# Patient Record
Sex: Male | Born: 1960 | Race: White | Hispanic: No | Marital: Married | Smoking: Former smoker
Health system: Southern US, Community
[De-identification: ages and names within clinical notes are randomized; demographics above are authoritative.]

## PROBLEM LIST (undated history)

## (undated) DIAGNOSIS — I509 Heart failure, unspecified: Secondary | ICD-10-CM

## (undated) DIAGNOSIS — K219 Gastro-esophageal reflux disease without esophagitis: Secondary | ICD-10-CM

## (undated) DIAGNOSIS — I219 Acute myocardial infarction, unspecified: Secondary | ICD-10-CM

## (undated) DIAGNOSIS — R519 Headache, unspecified: Secondary | ICD-10-CM

## (undated) DIAGNOSIS — IMO0001 Reserved for inherently not codable concepts without codable children: Secondary | ICD-10-CM

## (undated) DIAGNOSIS — F32A Depression, unspecified: Secondary | ICD-10-CM

## (undated) DIAGNOSIS — Z95810 Presence of automatic (implantable) cardiac defibrillator: Secondary | ICD-10-CM

## (undated) DIAGNOSIS — I209 Angina pectoris, unspecified: Secondary | ICD-10-CM

## (undated) DIAGNOSIS — I639 Cerebral infarction, unspecified: Secondary | ICD-10-CM

## (undated) DIAGNOSIS — I499 Cardiac arrhythmia, unspecified: Secondary | ICD-10-CM

## (undated) DIAGNOSIS — I10 Essential (primary) hypertension: Secondary | ICD-10-CM

## (undated) DIAGNOSIS — F329 Major depressive disorder, single episode, unspecified: Secondary | ICD-10-CM

## (undated) DIAGNOSIS — I2510 Atherosclerotic heart disease of native coronary artery without angina pectoris: Secondary | ICD-10-CM

## (undated) DIAGNOSIS — N2889 Other specified disorders of kidney and ureter: Secondary | ICD-10-CM

## (undated) DIAGNOSIS — R51 Headache: Secondary | ICD-10-CM

## (undated) DIAGNOSIS — Z950 Presence of cardiac pacemaker: Secondary | ICD-10-CM

## (undated) DIAGNOSIS — M1990 Unspecified osteoarthritis, unspecified site: Secondary | ICD-10-CM

## (undated) DIAGNOSIS — R011 Cardiac murmur, unspecified: Secondary | ICD-10-CM

## (undated) HISTORY — PX: CARDIAC VALVE REPLACEMENT: SHX585

## (undated) HISTORY — PX: CORONARY ANGIOPLASTY: SHX604

## (undated) HISTORY — PX: CORONARY ARTERY BYPASS GRAFT: SHX141

---

## 1998-10-24 ENCOUNTER — Inpatient Hospital Stay (HOSPITAL_COMMUNITY): Admission: EM | Admit: 1998-10-24 | Discharge: 1998-10-27 | Payer: Self-pay | Admitting: Emergency Medicine

## 1998-10-24 ENCOUNTER — Encounter: Payer: Self-pay | Admitting: Emergency Medicine

## 2000-11-30 ENCOUNTER — Encounter: Payer: Self-pay | Admitting: Emergency Medicine

## 2000-11-30 ENCOUNTER — Inpatient Hospital Stay (HOSPITAL_COMMUNITY): Admission: EM | Admit: 2000-11-30 | Discharge: 2000-12-03 | Payer: Self-pay | Admitting: Emergency Medicine

## 2001-11-16 ENCOUNTER — Inpatient Hospital Stay (HOSPITAL_COMMUNITY): Admission: EM | Admit: 2001-11-16 | Discharge: 2001-11-19 | Payer: Self-pay | Admitting: Emergency Medicine

## 2001-11-16 ENCOUNTER — Encounter: Payer: Self-pay | Admitting: Emergency Medicine

## 2001-12-09 ENCOUNTER — Encounter (HOSPITAL_COMMUNITY): Admission: RE | Admit: 2001-12-09 | Discharge: 2002-02-11 | Payer: Self-pay | Admitting: Internal Medicine

## 2002-07-07 ENCOUNTER — Encounter: Payer: Self-pay | Admitting: Surgery

## 2002-07-07 ENCOUNTER — Inpatient Hospital Stay (HOSPITAL_COMMUNITY): Admission: EM | Admit: 2002-07-07 | Discharge: 2002-07-12 | Payer: Self-pay | Admitting: Emergency Medicine

## 2002-07-07 ENCOUNTER — Encounter: Payer: Self-pay | Admitting: Emergency Medicine

## 2002-07-08 ENCOUNTER — Encounter: Payer: Self-pay | Admitting: Surgery

## 2002-07-09 ENCOUNTER — Encounter: Payer: Self-pay | Admitting: Surgery

## 2002-07-31 ENCOUNTER — Encounter (HOSPITAL_COMMUNITY): Admission: RE | Admit: 2002-07-31 | Discharge: 2002-09-03 | Payer: Self-pay | Admitting: Internal Medicine

## 2002-08-25 ENCOUNTER — Inpatient Hospital Stay (HOSPITAL_COMMUNITY): Admission: EM | Admit: 2002-08-25 | Discharge: 2002-08-31 | Payer: Self-pay | Admitting: Psychiatry

## 2003-06-01 ENCOUNTER — Inpatient Hospital Stay (HOSPITAL_COMMUNITY): Admission: EM | Admit: 2003-06-01 | Discharge: 2003-06-02 | Payer: Self-pay | Admitting: Emergency Medicine

## 2003-06-08 ENCOUNTER — Encounter: Admission: RE | Admit: 2003-06-08 | Discharge: 2003-06-08 | Payer: Self-pay | Admitting: Family Medicine

## 2003-07-20 ENCOUNTER — Emergency Department (HOSPITAL_COMMUNITY): Admission: EM | Admit: 2003-07-20 | Discharge: 2003-07-20 | Payer: Self-pay | Admitting: Emergency Medicine

## 2003-07-20 ENCOUNTER — Inpatient Hospital Stay (HOSPITAL_COMMUNITY): Admission: RE | Admit: 2003-07-20 | Discharge: 2003-07-26 | Payer: Self-pay | Admitting: Psychiatry

## 2004-03-19 ENCOUNTER — Emergency Department (HOSPITAL_COMMUNITY): Admission: EM | Admit: 2004-03-19 | Discharge: 2004-03-20 | Payer: Self-pay | Admitting: Emergency Medicine

## 2004-04-28 ENCOUNTER — Ambulatory Visit: Payer: Self-pay | Admitting: Internal Medicine

## 2005-06-27 IMAGING — CR DG CHEST 2V
2 series · 2 of 2 positions shown · non-contrast
Comparison: 06/01/03.

CLINICAL DATA: Chest pain. 
 TWO VIEW CHEST  - 03/19/04:

[view not recorded (1 of 2)]
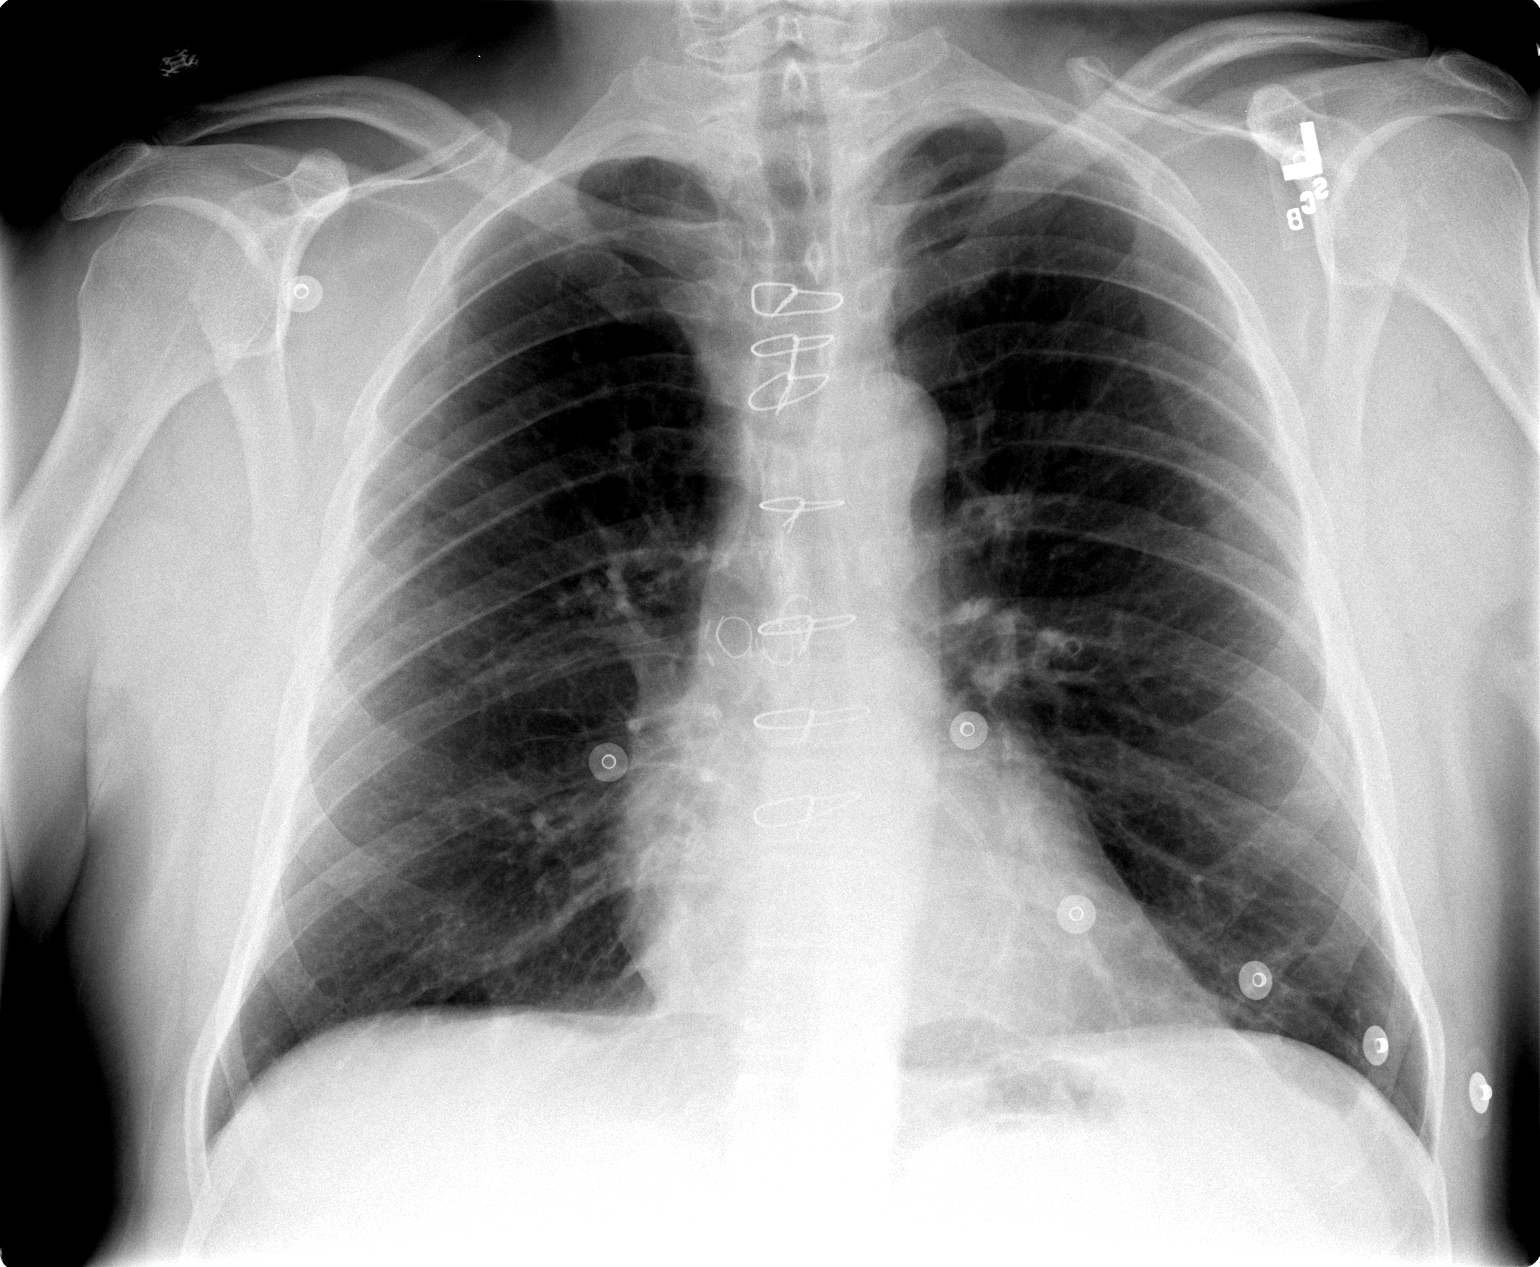

[view not recorded (2 of 2)]
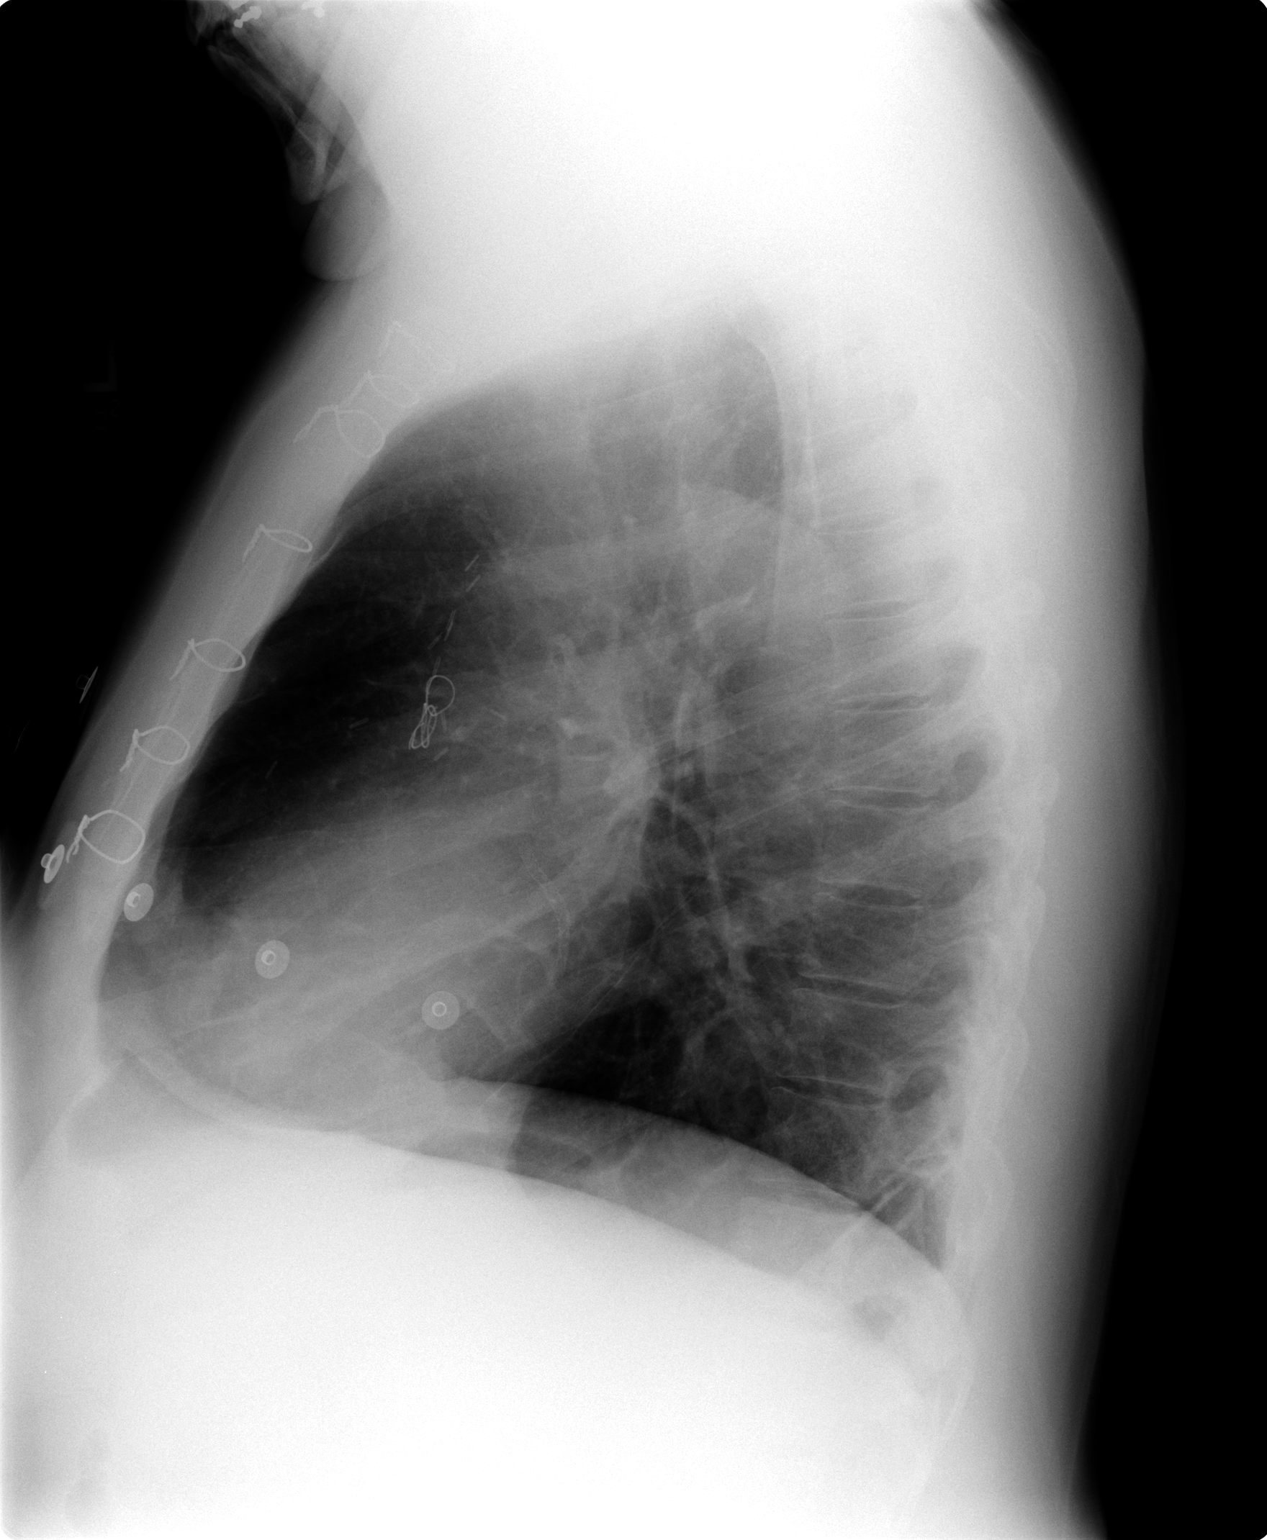

[2 of 2 positions shown; findings below may reference images not displayed]

There has been a previous median sternotomy.  The lungs are clear.  The heart is normal in size.
IMPRESSION: Evidence for previous median sternotomy.  No evidence for active chest disease.

## 2006-01-14 ENCOUNTER — Ambulatory Visit: Payer: Self-pay | Admitting: Internal Medicine

## 2006-01-14 ENCOUNTER — Inpatient Hospital Stay (HOSPITAL_COMMUNITY): Admission: EM | Admit: 2006-01-14 | Discharge: 2006-01-18 | Payer: Self-pay | Admitting: Family Medicine

## 2006-01-15 ENCOUNTER — Encounter: Payer: Self-pay | Admitting: Internal Medicine

## 2006-01-29 ENCOUNTER — Ambulatory Visit: Payer: Self-pay | Admitting: Cardiology

## 2006-03-01 ENCOUNTER — Ambulatory Visit: Payer: Self-pay | Admitting: Family Medicine

## 2006-03-30 ENCOUNTER — Ambulatory Visit: Payer: Self-pay | Admitting: Family Medicine

## 2006-07-12 ENCOUNTER — Ambulatory Visit: Payer: Self-pay | Admitting: Family Medicine

## 2006-07-13 ENCOUNTER — Encounter: Payer: Self-pay | Admitting: Family Medicine

## 2006-07-16 ENCOUNTER — Ambulatory Visit: Payer: Self-pay | Admitting: Family Medicine

## 2006-07-23 ENCOUNTER — Ambulatory Visit: Payer: Self-pay | Admitting: Family Medicine

## 2006-08-17 DIAGNOSIS — I10 Essential (primary) hypertension: Secondary | ICD-10-CM | POA: Insufficient documentation

## 2006-08-17 DIAGNOSIS — I2510 Atherosclerotic heart disease of native coronary artery without angina pectoris: Secondary | ICD-10-CM | POA: Insufficient documentation

## 2006-08-17 DIAGNOSIS — Z9189 Other specified personal risk factors, not elsewhere classified: Secondary | ICD-10-CM | POA: Insufficient documentation

## 2006-10-29 ENCOUNTER — Ambulatory Visit: Payer: Self-pay | Admitting: Family Medicine

## 2006-10-29 DIAGNOSIS — R1011 Right upper quadrant pain: Secondary | ICD-10-CM | POA: Insufficient documentation

## 2006-10-29 LAB — CONVERTED CEMR LAB
Alkaline Phosphatase: 66 units/L (ref 39–117)
Basophils Relative: 1.3 % — ABNORMAL HIGH (ref 0.0–1.0)
Bilirubin, Direct: 0.2 mg/dL (ref 0.0–0.3)
CO2: 30 meq/L (ref 19–32)
Creatinine, Ser: 1 mg/dL (ref 0.4–1.5)
Direct LDL: 145 mg/dL
Eosinophils Relative: 4.6 % (ref 0.0–5.0)
Glucose, Bld: 99 mg/dL (ref 70–99)
Glucose, Urine, Semiquant: NEGATIVE
HCT: 45.1 % (ref 39.0–52.0)
Hemoglobin: 15.8 g/dL (ref 13.0–17.0)
Lymphocytes Relative: 26.8 % (ref 12.0–46.0)
Monocytes Absolute: 0.9 10*3/uL — ABNORMAL HIGH (ref 0.2–0.7)
Neutrophils Relative %: 57.9 % (ref 43.0–77.0)
Potassium: 4.7 meq/L (ref 3.5–5.1)
Protein, U semiquant: NEGATIVE
RDW: 13.1 % (ref 11.5–14.6)
Sodium: 143 meq/L (ref 135–145)
TSH: 2.46 microintl units/mL (ref 0.35–5.50)
Total Bilirubin: 1.1 mg/dL (ref 0.3–1.2)
Total CHOL/HDL Ratio: 8.9
Total Protein: 7.6 g/dL (ref 6.0–8.3)
Urobilinogen, UA: 0.2
VLDL: 71 mg/dL — ABNORMAL HIGH (ref 0–40)
WBC Urine, dipstick: NEGATIVE

## 2006-11-02 ENCOUNTER — Telehealth: Payer: Self-pay | Admitting: Family Medicine

## 2006-11-02 ENCOUNTER — Ambulatory Visit: Payer: Self-pay | Admitting: Family Medicine

## 2006-11-02 DIAGNOSIS — E785 Hyperlipidemia, unspecified: Secondary | ICD-10-CM

## 2006-11-05 ENCOUNTER — Ambulatory Visit: Payer: Self-pay | Admitting: Family Medicine

## 2006-11-07 ENCOUNTER — Encounter: Admission: RE | Admit: 2006-11-07 | Discharge: 2006-11-07 | Payer: Self-pay | Admitting: Family Medicine

## 2006-12-05 ENCOUNTER — Ambulatory Visit: Payer: Self-pay | Admitting: Family Medicine

## 2006-12-05 DIAGNOSIS — J309 Allergic rhinitis, unspecified: Secondary | ICD-10-CM | POA: Insufficient documentation

## 2007-03-14 DIAGNOSIS — L0293 Carbuncle, unspecified: Secondary | ICD-10-CM

## 2007-03-14 DIAGNOSIS — L0292 Furuncle, unspecified: Secondary | ICD-10-CM | POA: Insufficient documentation

## 2007-03-18 ENCOUNTER — Ambulatory Visit: Payer: Self-pay | Admitting: Family Medicine

## 2007-03-19 ENCOUNTER — Encounter: Payer: Self-pay | Admitting: Family Medicine

## 2007-03-21 ENCOUNTER — Telehealth: Payer: Self-pay | Admitting: Family Medicine

## 2007-05-14 ENCOUNTER — Ambulatory Visit: Payer: Self-pay | Admitting: Cardiology

## 2007-05-14 ENCOUNTER — Inpatient Hospital Stay (HOSPITAL_COMMUNITY): Admission: EM | Admit: 2007-05-14 | Discharge: 2007-05-16 | Payer: Self-pay | Admitting: Emergency Medicine

## 2007-05-15 ENCOUNTER — Ambulatory Visit: Payer: Self-pay | Admitting: Cardiothoracic Surgery

## 2007-06-10 ENCOUNTER — Ambulatory Visit: Payer: Self-pay | Admitting: Cardiology

## 2007-06-10 LAB — CONVERTED CEMR LAB
Alkaline Phosphatase: 48 units/L (ref 39–117)
Bilirubin, Direct: 0.1 mg/dL (ref 0.0–0.3)
GFR calc Af Amer: 76 mL/min
Glucose, Bld: 92 mg/dL (ref 70–99)
Potassium: 4.2 meq/L (ref 3.5–5.1)
Sodium: 141 meq/L (ref 135–145)
Total Protein: 8 g/dL (ref 6.0–8.3)
VLDL: 27 mg/dL (ref 0–40)

## 2008-06-17 ENCOUNTER — Encounter (INDEPENDENT_AMBULATORY_CARE_PROVIDER_SITE_OTHER): Payer: Self-pay | Admitting: *Deleted

## 2008-08-10 ENCOUNTER — Telehealth (INDEPENDENT_AMBULATORY_CARE_PROVIDER_SITE_OTHER): Payer: Self-pay | Admitting: *Deleted

## 2008-08-21 IMAGING — CR DG CHEST 1V PORT
1 series · 1 of 1 positions shown · non-contrast
Comparison: 01/15/2006

CLINICAL DATA: Chest pain

PORTABLE CHEST - 1 VIEW

[AP]
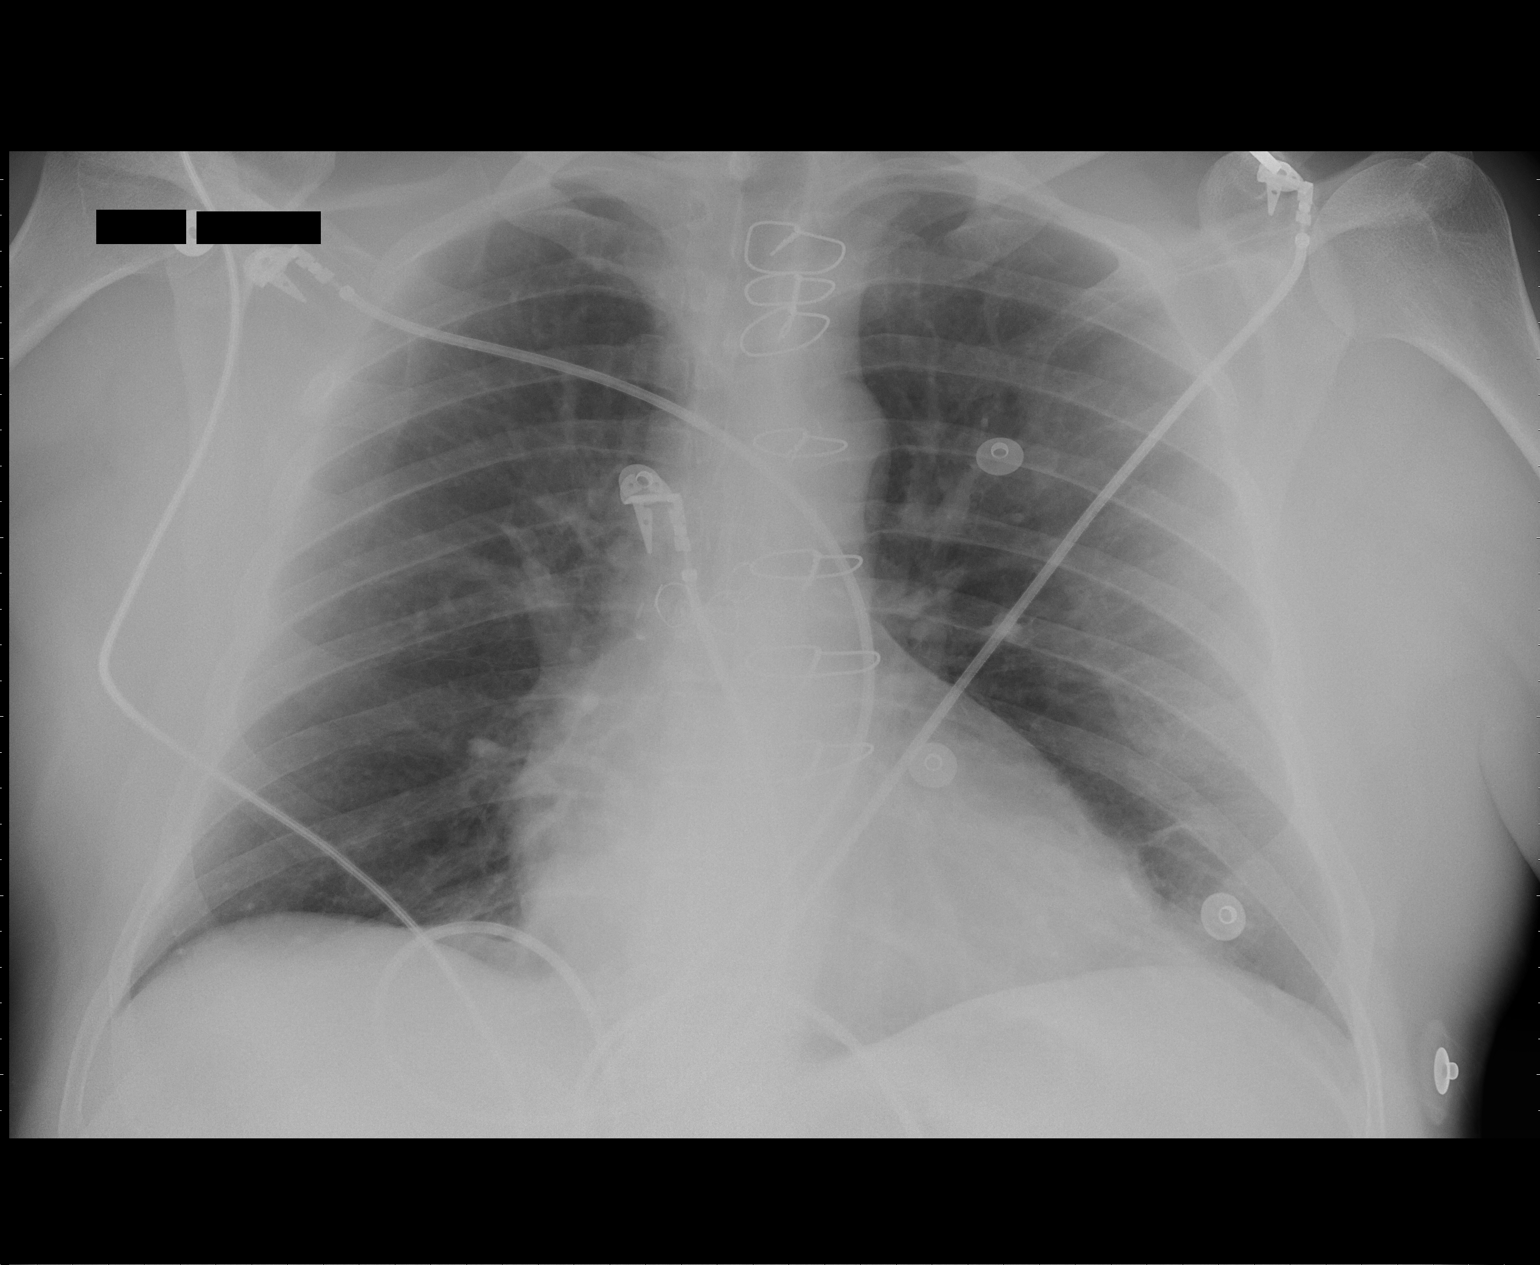

[1 of 1 positions shown; findings below may reference images not displayed]

FINDINGS: Post CABG.  Heart, vascularity, and lungs normal.
Minimal scarring at the left base.  Osseous structures intact in
one-view.
IMPRESSION: Chronic and postoperative changes - no active disease.

## 2009-08-06 ENCOUNTER — Telehealth (INDEPENDENT_AMBULATORY_CARE_PROVIDER_SITE_OTHER): Payer: Self-pay | Admitting: *Deleted

## 2010-01-23 DIAGNOSIS — I639 Cerebral infarction, unspecified: Secondary | ICD-10-CM

## 2010-01-23 HISTORY — DX: Cerebral infarction, unspecified: I63.9

## 2010-02-22 NOTE — Progress Notes (Signed)
 ----   Converted from flag ---- ---- 08/06/2009 4:42 PM, Julieta Gutting, RN, BSN wrote:   ---- 08/06/2009 4:33 PM, Ferman Hamming, MD, [facility] wrote: Please ask patient to schedule f/u ov (I have not seen in over 2 years); tell him to dc zocor until then and begin crestor 20 daily; lipids and liver in six weeks BC  ---- 08/06/2009 3:38 PM, Julieta Gutting, RN, BSN wrote: this pt is requesting a refill for Simvastatin 80mg  and is taking Norvasc 10mg --please send flag back to the [facility] triage nurse because Stanton Kidney and I are both out next week ------------------------------  Phone Note Outgoing Call   Call placed by: Scherrie Bateman, LPN,  August 11, 2009 12:19 PM Call placed to: Patient's wife Summary of Call: SPOKE WITH PT'S WIFE SEES CARDIOLOGIST OUT OF [place] INSTRUCTED TO CALL THAT OFFICE  FOR ANY REFILLS.VERBALIZED UNDERSTANDING. Initial call taken by: Scherrie Bateman, LPN,  August 11, 2009 12:20 PM

## 2010-04-11 DIAGNOSIS — I2510 Atherosclerotic heart disease of native coronary artery without angina pectoris: Secondary | ICD-10-CM

## 2010-04-11 DIAGNOSIS — I059 Rheumatic mitral valve disease, unspecified: Secondary | ICD-10-CM

## 2010-04-13 DIAGNOSIS — I059 Rheumatic mitral valve disease, unspecified: Secondary | ICD-10-CM

## 2010-04-13 DIAGNOSIS — I2510 Atherosclerotic heart disease of native coronary artery without angina pectoris: Secondary | ICD-10-CM

## 2010-04-26 DIAGNOSIS — I2510 Atherosclerotic heart disease of native coronary artery without angina pectoris: Secondary | ICD-10-CM

## 2010-04-26 DIAGNOSIS — I059 Rheumatic mitral valve disease, unspecified: Secondary | ICD-10-CM

## 2010-04-28 DIAGNOSIS — I059 Rheumatic mitral valve disease, unspecified: Secondary | ICD-10-CM

## 2010-04-28 DIAGNOSIS — I2510 Atherosclerotic heart disease of native coronary artery without angina pectoris: Secondary | ICD-10-CM

## 2010-05-10 ENCOUNTER — Encounter (INDEPENDENT_AMBULATORY_CARE_PROVIDER_SITE_OTHER): Payer: Medicaid Other

## 2010-05-10 DIAGNOSIS — I2510 Atherosclerotic heart disease of native coronary artery without angina pectoris: Secondary | ICD-10-CM

## 2010-05-10 DIAGNOSIS — I059 Rheumatic mitral valve disease, unspecified: Secondary | ICD-10-CM

## 2010-05-12 NOTE — Assessment & Plan Note (Unsigned)
[place] OFFICE VISIT  TIA, GELB DOB:  09-May-1960                                        May 11, 2010 CHART #:  [id number]  In the clinic today, to remove the staples from his sternal incision. The staples were removed and the wound is healing nicely.  The sternum is stable and the patient is doing quite well at home.  He is walking and will be seeing his cardiologist next week.  He will return for his routine followup with [place] in approximately 4 weeks.  Tera Mater. Arvilla Market, MD  BC/MEDQ  D:  05/11/2010  T:  05/12/2010  Job:  [id number]

## 2010-06-07 NOTE — Cardiovascular Report (Signed)
[name], [name] NO.:  [id number]   MEDICAL RECORD NO.:  [id number]          PATIENT TYPE:  INP   LOCATION:  1827                         FACILITY:  [facility]   PHYSICIAN:  Veverly Fells. Excell Seltzer, MD  DATE OF BIRTH:  Jun 06, 1960   DATE OF PROCEDURE:  DATE OF DISCHARGE:                            CARDIAC CATHETERIZATION   PROCEDURE:  Left heart catheterization, selective coronary angiography,  left ventricular angiography, saphenous vein graft angiography, PTCA and  stenting of the saphenous vein graft to the right coronary artery and  Angio-Seal of the right femoral artery.   INDICATIONS:  [name] is a 50 year old gentleman who presented with an  acute coronary syndrome.  He has known CAD and prior bypass surgery.  He  has also had prior PCI.  He presented with new onset chest pain and had  dynamic EKG changes.  There was minimal inferior ST elevation.  He was  brought up urgently for cardiac catheterization.   Risks and indications of procedure were reviewed with the patient.  Informed consent was obtained.  The right groin was prepped and draped,  anesthetized with 1% lidocaine.  Using modified Seldinger technique, a 6-  French sheath was placed in the right femoral artery.  Standard 6-French  catheters were used for coronary angiography and left ventriculography.  The JR-4 catheter was used for saphenous vein graft angiography.   At the completion of the diagnostic procedure, I elected to intervene on  a severely degenerated saphenous vein graft that was sequenced to the  PDA and posterolateral branches of the right coronary artery.  This was  clearly the patient's culprit vessel.  The native vessels are very small  and not suitable for redo surgery.  The saphenous vein graft has severe  degeneration with stenosis throughout in multiple areas.  I called Dr.  Donata Clay who was on-call for cardiac surgery and he came down to review  the films.  He was in  agreement that the targets were not suitable for  redo surgery.  Angiomax was used for anticoagulation.  The patient was  loaded with 600 mg of Plavix.  The sheath was up sized to a 7-French.  A  7-French RCB guide catheter was inserted and initially I attempted to  use Proxis proximal embolic protection device.  However, the vein graft  turned out to be too big in the proximal portion for that device to  occlude flow.  Therefore, I elected to treat the proximal portion of the  vein graft using a filter wire for distal protection.  The filter wire  was advanced into the distal body of the graft without difficulty.  There was a long area of severe stenosis with severe graft degeneration  and it was treated with 2 overlapping driver stents.  The first stent  was a 4.0 x 30-mm driver, which was deployed at 14 atmospheres, the  second stent which was placed in overlapping fashion in the proximal  portion of the vein graft was a 4.0 x 24-mm driver, it was deployed at  16 atmospheres.  Following stenting,  there was good flow in the vessel.  I elected to post dilate the entire area with a 5.0 x 15-mm Quantum  Maverick, which was dilated on 5 inflations up to a maximum of 16  atmospheres to cover the entire stented segment.  At that point, I  elected to advance the Proxis device down further so that it could be  used to protect the distal graft lesions.  There was a lesion in the  distal body of the first portion of the graft and has lesion in the  sequence portion of the graft as well.  Both lesions were severe in the  range of 80-90%.  After the Proxis device was advanced down, the balloon  was inflated and the vessel was wired with a Krueger guidewire into the  distal PL branch.  The downstream lesions were then dilated with a 2.5 x  15-mm sprinter, which was taken to 8 atmospheres in the distal sequence  graft and 10 atmospheres in the first portion of the graft.  I then  elected to stent the  distal lesion with a 3.0 x 15-mm driver, which was  deployed at 14 atmospheres.  The stent was well expanded and there was  good flow following stent deployment.  The second lesion which again was  in the distal portion of the first part of the sequence graft was a 3.5  x 30-mm driver stent.  This was deployed at 14 atmospheres.  This stent  also was well expanded and demonstrated good flow following stenting.  The Proxis device was then pulled back into the guide catheter.  Final  angiography demonstrated widely patent stents with TIMI III flow.  There  was an excellent angiographic result.  The patient tolerated the  procedure well and had no immediate complications.  An 8-French  AngioSeal device was used to close the femoral arteriotomy.   FINDINGS:  Left ventricular pressure 118/16, aortic pressure 123/72 with  a mean of 93.   CORONARY ANGIOGRAPHY:  The left mainstem is diffusely diseased.  There  is 50% stenosis throughout.  It bifurcates into the LAD and left  circumflex.   LAD:  The LAD is a moderate size vessel.  It courses down and reaches  the LV apex of the proximal LAD just before the first diagonal has a  smooth 70% stenosis.  The vessel then has mild diffuse disease  throughout its remaining portions.  Overall, the LAD is widely patent  other than the lesion mentioned above.  The first diagonal branch is  grafted.  The native vessel images show retrograde flow into the graft.  There are two lower diagonals that are patent with no significant  stenosis.   LEFT CIRCUMFLEX:  The left circumflex is severely diseased in its  proximal portion and occludes just beyond the first OM in a stented  segment.  There are faint collaterals provided by the right coronary  artery.  The first OM branch of the left circumflex is small diffusely  diseased with a 70% proximal stenosis.   RIGHT CORONARY ARTERY:  The right coronary artery is severely diseased,  it is occluded proximally  and supplies one acute marginal branch.   SAPHENOUS VEIN GRAFT ANGIOGRAPHY:  Saphenous vein graft to the first  diagonal has diffuse irregularities, but it is widely patent.  The  diagonals widely patent as well.  The vein graft fills the LAD  competitively in retrograde fashion.   Saphenous vein graft to the OM is occluded  at the aortic anastomosis.   Saphenous vein graft sequenced to the PDA and posterolateral branches of  the right coronary artery is severely diseased, the proximal and  midportions of the graft have filling defects and severe stenosis in the  range of 70-80%, this covers a long segment of the vessel.  The  midportion has diffuse irregularities.  Distally the graft has other  areas of severe stenosis with 80% stenosis just before the PDA branch  and just beyond the PDA branch in the second portion of the sequence,  there is 90% stenosis with thrombus.   Left ventriculography shows mild inferior hypokinesis with an LVEF of  45%.  There is no mitral regurgitation.   ASSESSMENT:  1. Severe native three-vessel coronary artery disease.  2. Status post coronary artery bypass graft with occluded saphenous      vein graft to the obtuse marginal branch of left circumflex, a      severely diseased saphenous vein graft sequenced to the PDA and PL      branches of the right coronary artery, and a patent saphenous vein      graft to the first diagonal.  3. Mild left ventricular systolic dysfunction.  4. Successful stenting of the saphenous vein graft to the PDA using      multiple bare-metal stents.   PLAN:  Mr. Maione has severe CAD.  He will require ongoing aggressive  medical therapy.  I will review his films to consider a staged LAD  intervention versus ongoing medical therapy.      Veverly Fells. Excell Seltzer, MD  Electronically Signed     MDC/MEDQ  D:  05/14/2007  T:  05/15/2007  Job:  [id number]

## 2010-06-07 NOTE — Assessment & Plan Note (Signed)
[facility]                            CARDIOLOGY OFFICE NOTE   NAME:[name], [name]                         MRN:          [id number]  DATE:06/10/2007                            DOB:          Jun 27, 1960    [name] is a 50 year old gentleman who has a history of coronary  artery disease, status post coronary artery bypassing graft performed in  June 2004.  He recently was admitted to [facility] with  recurrent chest pain.  He did rule in for a myocardial infarction.  He  did undergo urgent cardiac catheterization due to chest pain.  The  patient had a 50% stenosis in the left main.  There was a 70% LAD just  before the first diagonal.  The first diagonal was grafted.  The  circumflex was severely diseased in the proximal portion and occluded  just beyond the first marginal.  There were collaterals from the right.  The right coronary artery was severely diseased and occluded.  The  saphenous vein graft to the first diagonal was patent.  The saphenous  vein graft to the OM was occluded.  The saphenous vein graft to the PDA  and posterolateral branches was severely diseased with a 70-80% lesion.  The ejection fraction 45%.  The patient subsequently had PCI of the  septum and vein graft to PDA using multiple bare metal stents.  Since  then, he has minimal dyspnea on exertion.  He does have some orthopnea,  but there is no PND.  He denies any pedal edema.  He occasionally feels  chest tightness that increases with inspiration but attributes this to  his tobacco use.  He has not had chest pain similar to his infarct pain.  Note the tightness does not radiate, nor is it associated with shortness  of breath, nausea, vomiting, or diaphoresis.  Note the patient did  discontinue his tobacco use.   MEDICATIONS AT PRESENT:  1. Aspirin 325 mg p.o. daily.  2. Lasix 40 mg p.o. daily.  3. Carvedilol 6.25 mg p.o. b.i.d.  4. Lisinopril 20 grams p.o. daily.  5. Plavix 75 mg p.o. daily.  6. Amlodipine 10 mg p.o. daily.  7. Zocor 80 mg p.o. daily.   PHYSICAL EXAMINATION:  VITAL SIGNS:  Blood pressure 102/69, pulse 78, he  weighs 208 pounds.  HEENT:  Normal.  NECK:  Supple.  There are no bruits noted.  CHEST:  Clear.  CARDIOVASCULAR:  Irregular.  ABDOMEN:  Shows no tenderness.  EXTREMITIES:  Show no edema.   His electrocardiogram shows sinus rhythm at a rate of 75.  The axis is  normal.  There is inferolateral T-wave inversion.   DIAGNOSES:  1. Coronary artery disease, status post recent percutaneous coronary      intervention of the saphenous vein graft to posterior descending      artery/slash posterolateral - [name] has had no chest pain      similar to his infarct pain.  He will continue his aspirin, Plavix,      beta blocker, statin, and ACE inhibitor.  Note,  he has discontinued      his tobacco use.  2. Question orthopnea - I do not find [name] volume overloaded on      examination today.  I will check a BNP.  For now, we will continue      with his present dose of Lasix, and I will also check a BMET.  3. Chest tightness - This appears to be related to his history of      tobacco use.  Is unlike his previous infarct pain.  We will follow      this.  4. Hypertension - His blood pressures is adequately controlled on his      present medications.  5. Hyperlipidemia.  He will continue his statin.  I will check lipids      and liver today and adjust as indicated.  6. Tobacco abuse - He has discontinued this.  7. Chronic obstructive pulmonary disease.   We will see him back in 6 months.     Madolyn Frieze Jens Som, MD, [facility]  Electronically Signed    BSC/MedQ  DD: 06/10/2007  DT: 06/10/2007  Job #: [id number]

## 2010-06-07 NOTE — Discharge Summary (Signed)
[name], [name] NO.:  [id number]   MEDICAL RECORD NO.:  [id number]          PATIENT TYPE:  INP   LOCATION:  6533                         FACILITY:  [facility]   PHYSICIAN:  Veverly Fells. Excell Seltzer, MD  DATE OF BIRTH:  1960-02-07   DATE OF ADMISSION:  05/14/2007  DATE OF DISCHARGE:  05/16/2007                               DISCHARGE SUMMARY   PRIMARY CARDIOLOGIST:  Dr. Ivor Messier.   PRIMARY CARE Maveric Debono:  Dr. Tinnie Gens A. Todd.   DISCHARGE DIAGNOSIS:  Acute inferior ST-segment elevation myocardial  infarction.   SECONDARY DIAGNOSES:  1. Coronary artery disease status post coronary artery bypass graft in      June 2004.  2. Ongoing tobacco abuse.  3. Chronic obstructive pulmonary disease.  4. Depression.  5. Hyperlipidemia.  6. Hypertension.  7. A remote history of cocaine and marijuana abuse.   ALLERGIES:  No known drug allergies.   PROCEDURES:  Left heart cardiac catheterization with successful PCI and  stenting of the vein graft to the distal right coronary artery with  placement of 4 Medtronic Driver RX bare-metal stent.   HISTORY OF PRESENT ILLNESS:  A 50 year old married Caucasian male with  prior history of CAD status post CABG in 2004.  The patient was in his  usual state of health until the morning of May 14, 2007 when at  approximately 8:00 a.m., he had sudden onset of chest pain with  radiation to the left shoulder similar to previous MI pain.  The patient  presented to the [facility] ED where he was to have subtle inferolateral  ST-segment elevation with ongoing pain despite multiple sublingual  nitroglycerin tablets.  Code STEMI was activated and the patient was  taken emergently to the cardiac cath lab.   HOSPITAL COURSE:  The patient underwent left heart cardiac  catheterization revealing significant multivessel disease with occluded  vein graft to the obtuse marginal and 80% stenosis in the vein graft to  the PDA.  The films were  reviewed by Dr. Tyrone Sage of  cardiothoracic  surgery, and due to poor distal targets it was felt that the patient  would benefit most from either medical therapy or PCI with a vein graft  to the PDA.  Attention was then turned to the vein graft to the PDA and  this was successfully stented with 4 Driver RX bare-metal stents.  EF  was 45% with inferior hypokinesis.  The patient was maintained on  aspirin, statin beta-blocker, ACE inhibitor, and Plavix therapy and has  had no additional chest discomfort.  He did eventually peak his CK at  665 with an MB of 99.5 and troponin I of 12.23.  The patient was  transferred on to the floor on April 22 and has been ambulating without  recurrent discomfort or limitations.  He will be discharged home today  in good condition.   DISCHARGE LABS:  Hemoglobin 13.5, hematocrit 38.2, WBC 9.3, platelets  272, sodium 139, potassium 3.6, chloride 102, CO2 28, BUN 12, creatinine  0.96, glucose 109, total bilirubin 0.7, alkaline phosphatase 58, AST 20,  ALT 21, total protein 7.6, albumin 3.9, calcium 96, magnesium 2.0, CK  247, MB 15.8, troponin I 6.4, total cholesterol 199, triglycerides 236,  HDL 28, LDL 124, and TSH 1.607.   DISPOSITION:  The patient is being discharged home today in good  condition.   FOLLOWUP PLAN AND APPOINTMENTS:  We have arranged for followup with Dr.  Jens Som on Jun 10, 2007 at 8:30 a.m.  He was asked to follow up with  Dr. Tawanna Cooler as previously scheduled.   DISCHARGE MEDICATIONS:  1. Aspirin 325 mg daily.  2. Plavix 75 mg daily.  3. Norvasc 10 mg daily.  4. Lisinopril 20 mg daily.  5. Coreg 6.25 mg b.i.d.  6. Lasix 20 mg daily.  7. Zocor 80 mg nightly.  8. Nitroglycerin 0.4 mg sublingual p.r.n. chest pain.  The patient has      been counseled on the importance of smoking cessation.   OUTSTANDING LABORATORY STUDIES:  None.   DURATION OF DISCHARGE/ENCOUNTER:  Forty five minutes including physician  time.      Nicolasa Ducking, ANP      Veverly Fells. Excell Seltzer, MD  Electronically Signed    CB/MEDQ  D:  05/16/2007  T:  05/17/2007  Job:  [id number]   cc:   Tinnie Gens A. Tawanna Cooler, MD

## 2010-06-07 NOTE — Consult Note (Signed)
[name], [name] NO.:  [id number]   MEDICAL RECORD NO.:  [id number]          PATIENT TYPE:  INP   LOCATION:  1827                         FACILITY:  [facility]   PHYSICIAN:  Kerin Perna, M.D.  DATE OF BIRTH:  16-Apr-1960   DATE OF CONSULTATION:  DATE OF DISCHARGE:                                 CONSULTATION   PRIMARY CARDIOLOGIST:   Cardiology.   REASON FOR CONSULTATION:  Acute chest pain with saphenous vein graft  stenosis of the right coronary.   CHIEF COMPLAINT:  Chest pain.   HISTORY OF PRESENT ILLNESS:  I was asked to evaluate this 50 year old  [name] for potential redo coronary bypass surgery for recently  diagnosed severe recurrent disease of a saphenous vein graft to the  distal right coronary circulation.  The patient underwent an urgent  coronary bypass grafting x4 in June 2004 by Dr. Laneta Simmers following a  failed circumflex angioplasty intervention.  At that time the patient  presented with acute chest pain and a occluded drug-eluting stent in the  circumflex.  An attempt to open the stent was unsuccessful, and since  the patient had three-vessel disease, he underwent multivessel bypass  grafting at that time including a sequential vein graft to the posterior  descending posterolateral vessels of the right coronary circulation.  Dr. Laneta Simmers described the vessels to the small but graftable.  He also on  one vein grafts to the diagonal and a vein graft to the second  circumflex marginal.  The LAD itself was non-disease..  The patient did  well after the emergency operation, but developed recurrent pains and  underwent a repeat cath in 2007, which demonstrated an occluded vein  graft to the circumflex marginal.  The vein graft to the distal right  circulation and the diagonal were patent.  He was treated medically and  did well until today when he awoke with severe chest pain and subtle EKG  changes in the inferior leads.  Cardiac enzymes  were pending, but the  patient was taken quickly to the cath lab where a left heart cath was  performed today by Dr. Excell Seltzer.  This demonstrated a patent vein graft to  the diagonal and a vein graft, although patent to the distal right  circulation, he had multiple high-grade lesions and atheromatous plaque  in the vein graft.  The native vessels beyond the anastomoses were very  small and diffusely diseased and did not appear to be adequate targets  for redo bypass surgery.  His EF was 45% with a inferior wall  hypokinesia.  The patient is hemodynamically stable on the cath lab and  a cardiothoracic surgical evaluation was requested.   PAST MEDICAL HISTORY:  1. Hypertension.  2. Dyslipidemia.  3. Active smoking.   MEDICATIONS:  Lisinopril, aspirin, beta-blocker, and Zocor.   PHYSICAL EXAMINATION:  VITAL SIGNS:  The patient is 5 feet 8  inches,  weighs 190 pounds, blood pressure in cath lab is 100/70, pulse is 80 and  regular.  He is completely covered on the cath lab table but is alert  and responsive.   LABORATORY DATA:  I reviewed the coronary arteriograms with Dr. Tonny Bollman.  The question at this point is to consider redo bypass grafting  to his right coronary circulation verses a percutaneous intervention  versus  medical therapy alone.  After reviewing the arteriograms his distal  right vessels are non adequate targets for redo bypass surgery, and I  would recommend either a medical therapy or an attempted percutaneous  intervention of the vein graft to the right coronary.      Kerin Perna, M.D.  Electronically Signed     PV/MEDQ  D:  05/14/2007  T:  05/15/2007  Job:  [id number]   cc:   [facility] office

## 2010-06-07 NOTE — H&P (Signed)
[name], [name] NO.:  [id number]   MEDICAL RECORD NO.:  [id number]         PATIENT TYPE:  INP   LOCATION:  1827                         FACILITY:  [facility]   PHYSICIAN:  Madolyn Frieze. Jens Som, MD, FACCDATE OF BIRTH:  [birth year]   DATE OF ADMISSION:  05/14/2007  DATE OF DISCHARGE:                              HISTORY & PHYSICAL   HISTORY OF PRESENT ILLNESS:  The patient is a 50 year old male with the  past medical history of coronary artery disease status post coronary  artery bypassing graft, hypertension, and hyperlipidemia, who has been  admitted with unstable angina and possible inferior myocardial  infarction.  The patient has had previous stents/myocardial infarctions.  In June 2004, the patient underwent coronary artery bypassing graft.  At  that time, he had a saphenous vein graft to his first diagonal,  saphenous vein graft to the second obtuse marginal, and saphenous vein  graft to the PDA and posterolateral sequentially.  His most recent  cardiac catheterization was performed on January 17, 2006.  At that  time, he was found to have no significant disease in his left main.  The  third diagonal had a 40% lesion.  The LAD proper had a 40% stenosis at  the origin of the first diagonal.  The left circumflex was occluded  proximally.  The first obtuse marginal had a 50% proximal stenosis.  The  circumflex filled via collaterals from the right coronary artery.  The  right coronary artery was occluded proximally.  There was 98% stenosis  going into an RV branch.  The saphenous vein graft in the first obtuse  marginal was occluded.  The saphenous vein graft sequentially to the PDA  and posterolateral was widely patent.  The saphenous vein graft to first  diagonal was patent as well.  Ejection fraction was 50%.  It was felt  that medical therapy was warranted.  The patient has not followed up  since January 29, 2006.  He states that he lost his job and has no  insurance.  Note, he typically does not have significant dyspnea on  exertion, orthopnea, PND, pedal edema, palpitations, presyncope,  syncope, or exertional chest pain.  This morning at approximately 8  o'clock, he developed pain in the chest area that radiated to his left  shoulder.  He states it is similar to his myocardial infarction pain.  There is a question of a pleuritic component and also increase when he  was lying flat.  There is associated nausea and shortness of breath, but  there was no diaphoresis.  He did take nitroglycerin at home, but the  pain did not completely resolve.  On presentation to the emergency room,  he continues to have the pain despite 5 sublingual nitroglycerins.  Cardiology is now asked to further evaluate.   ALLERGIES:  He has no known drug allergies.   SOCIAL HISTORY:  The patient does smoke.  He does not consume alcohol.   FAMILY HISTORY:  Positive for coronary artery disease.   PAST MEDICAL HISTORY:  Significant for hypertension and hyperlipidemia.  There is no  diabetes mellitus.  He has had a history of coronary artery  disease as outlined in the HPI.  He also apparently has COPD based on  previous notes.  There is also a history of depression.  He has remote  history of cocaine and marijuana use.   REVIEW OF SYSTEMS:  He denies any headaches, fevers, or chills.  There  is no productive cough or hemoptysis.  There is no dysphagia,  odynophagia, melena, or hematochezia.  There is no dysuria or hematuria.  There is no seizure activity.  There is no orthopnea, PND, or pedal  edema.  The remaining systems are negative.   PHYSICAL EXAMINATION:  VITAL SIGNS:  His pulse is 61.  GENERAL:  He is well developed, well nourished, in mild distress at the  time of the evaluation in the emergency room.  He does not appear to be  depressed.  BACK:  Normal.  HEENT:  Normal, with normal eyelids.  NECK:  Supple with a normal upstroke bilaterally.  No bruits  noted.  There is no jugular vein distention, and no thyromegaly is noted.  CHEST:  Clear to auscultation.  Normal expansion.  CARDIOVASCULAR:  Regular rate and rhythm.  Normal S1 and S2.  I cannot  appreciate murmurs, rubs, or gallops.  ABDOMEN:  Nontender.  Nondistended.  Positive bowel sounds.  No  hepatosplenomegaly.  No mass appreciated.  There is no abdominal bruit.  EXTREMITIES:  He has 2+ femoral pulses bilaterally.  No bruits.  Extremities show no edema that I can palpate.  No cords.  He has 2+  posterior tibial pulses bilaterally.  NEUROLOGIC:  Grossly intact.   DIAGNOSTIC STUDIES:  His electrocardiogram shows a sinus rhythm at a  rate of 62.  The axis is normal.  There is slight inferolateral ST  elevation of approximately 1 mm with reciprocal ST depression  anteriorly.  This was confirmed on follow-up electrocardiograms.  Also,  it was new compared to his last electrocardiogram on January 18, 2006.   DIAGNOSES:  1. Possible inferior myocardial infarction - Mr. Dona will be      admitted to telemetry and enzymes will be cycled.  He states that      this pain is identical to his previous infarct pain and there      appears to be slight elevation of his ST-segments in the      inferolateral leads.  We will plan to proceed with cardiac      catheterization.  The risk and benefits have been discussed and he      agrees to proceed.  He will continue on aspirin, and we will add      Plavix.  I would recommend continuing with his Coreg, statin, and      ACE inhibitor.  We will also add heparin and intravenous      nitroglycerin.  We will make further recommendations once we have      his anatomy.  2. Hypertension - we will continue with present blood pressure      medicines and adjust as indicated.  3. Hyperlipidemia - continue on his statin.  4. Tobacco abuse - we will discuss the importance of discontinuing      this.      Madolyn Frieze Jens Som, MD, [facility]  Electronically  Signed     BSC/MEDQ  D:  05/14/2007  T:  05/15/2007  Job:  [id number]

## 2010-06-08 ENCOUNTER — Encounter (INDEPENDENT_AMBULATORY_CARE_PROVIDER_SITE_OTHER): Payer: Self-pay

## 2010-06-08 DIAGNOSIS — I2510 Atherosclerotic heart disease of native coronary artery without angina pectoris: Secondary | ICD-10-CM

## 2010-06-08 DIAGNOSIS — I059 Rheumatic mitral valve disease, unspecified: Secondary | ICD-10-CM

## 2010-06-09 NOTE — Assessment & Plan Note (Unsigned)
[facility] OFFICE VISIT  LORING, LISKEY DOB:  [birth year]                                        Jun 08, 2010 CHART #:  [id number]  We saw the patient in the clinic today in followup for his coronary artery bypass grafting.  The patient has done quite well since discharge from the hospital.  His sternum is stable.  His wounds are well healed. He is walking for exercise.  He has seen the cardiologist yesterday and they are quite pleased with his progress.  He will continue seeing the cardiologist in followup and will continue with his exercise program. He was reminded not to lift more than 15 pounds until he reaches 3 months postop, and he will return to see [place] on a p.r.n. basis.  Tera Mater. Arvilla Market, MD  BC/MEDQ  D:  06/08/2010  T:  06/09/2010  Job:  [id number]

## 2010-06-10 NOTE — Discharge Summary (Signed)
[name], BHAGAT NO.:  [id number]   MEDICAL RECORD NO.:  [id number]          PATIENT TYPE:  INP   LOCATION:  3702                         FACILITY:  [facility]   PHYSICIAN:  Veverly Fells. Excell Seltzer, MD  DATE OF BIRTH:  Apr 05, 1960   DATE OF ADMISSION:  01/14/2006  DATE OF DISCHARGE:  01/18/2006                         DISCHARGE SUMMARY - REFERRING   DISCHARGE DIAGNOSES:  1. Hypertensive urgency associated with acute diastolic congestive      heart failure.  2. Progressive coronary artery disease.  3. History of hyperlipidemia.  4. Tobacco use with history of chronic obstructive pulmonary disease.  5. Medical noncompliance.  6. Hypokalemia.   PROCEDURES PERFORMED:  Left heart cardiac catheterization by Dr. Excell Seltzer  on January 17, 2006.   HISTORY:  [name] is a 50 year old male who has been noncompliant with  his medications.  For the preceding 18 months he has only been taking  aspirin.  Over the last 2 weeks he describes increasing shortness of  breath, orthopnea, PND as well as an 8-pound weight gain.  He also  presented with a 2-day history of chest discomfort radiating into his  left shoulder, worse with coughing.  At evaluation at the urgent care he  was found to be in CHF with sinus tachycardia, thus he was transferred  to [facility] for further treatment.   PAST MEDICAL HISTORY:  Notable for:  1. Known coronary artery disease.  2. Myocardial infarction in 2004 followed by emergent bypass surgery.      He had 3-vessel coronary disease with an EF of 50% to 55%.  3. He also has a history of hypertension.  4. COPD with continued tobacco use.  5. Depression.  6. Noncompliance.   LABORATORY DATA:  EKGs during hospitalization showed normal sinus  rhythm, normal axis, interventricular conduction delay, LVH.  Admission  H&H was 13.6 and 40, normal indices, WBCs were 10.7, platelets 231.  Prior to discharge H&H was 13.9 and 41.2, normal indices, platelets 337,  WBC  9.6.  Admission PTT was 32, PT 14.6, INR 1.  Sodium 139, potassium  3.2, BUN 10, creatinine 1.2, glucose 130.  Total direct bilirubin was  elevated at 2.5.  Remainder of the LFTs were within normal limits.  At  the time of discharge potassium was 4, sodium 138, BUN 16, creatinine 1,  glucose 126.  D-dimer slightly elevated on admission at 1.05.  CK-MBs,  relative indexes and troponins were within normal limits.  Admission BNP  was 1173; on January 15, 2006, his BNP was 576.  Amylase and lipase  were 25 and 14, respectively.  TSH 2.935.  Urine drug screen was  negative for amphetamines, barbiturates, benzodiazepines, cocaine,  opiates and THC.  Urinalysis was within normal limits.  Chest x-ray on  January 14, 2006, showed cardiomegaly, mild vascular congestion.  Repeat chest x-ray on January 15, 2006, showed mild CHF.  Abdominal  view on January 17, 2006, showed mild constipation without bowel  obstruction.  Total cholesterol was 168, triglycerides 103, HDL was low  at 131 and LDL was elevated at 116.  HOSPITAL COURSE:  [name] was admitted to 3700.  He was placed on IV  heparin by Dr. Gala Romney in addition to IV nitroglycerin and IV Lasix  for diuresis.  An echocardiogram was performed on January 15, 2006.  This showed EF of 55% to 60%, inferior hypokinesis, mild LVH, mild to  moderate MR, mild left atrial dilatation.  Over the next several days  diuresis continued with improvement of his symptoms.  Weight on  admission was not documented.  The only weight that I can find in the  computer was on January 17, 2006, at 190.1.  Medications continued to  be adjusted.  By January 16, 2006, Dr. Daleen Squibb felt his hypertensive  crisis had resolved.  his CHF had improved.  His hypokalemia was  supplemented.  The patient received information in regard to smoking  cessation.  Catheterization was arranged for further evaluation.  This  was performed on January 17, 2006, by Dr. Excell Seltzer.  This  revealed native  3-vessel coronary artery disease.  The saphenous vein graft to the  diagonal 1 was patent.  The saphenous vein graft to the PDA and PLA was  patent.  He had right-to-left collaterals.  The saphenous vein graft to  the OM was 100% occluded.  Dr. Excell Seltzer recommended continued medical  treatment and aggressive cardiac risk factor modification.  [name] was contacted by nursing on January 17, 2006, secondary to  hypertension with a blood pressure of 200/110.  He was also complaining  of nausea and abdominal discomfort.  LFTs, amylase, lipase, KUB were  unremarkable.  Medications were adjusted.  Progression Nurse assisted  with discharge needs.  By January 18, 2006, the patient felt much  better and was anxious to go home.  After review, Dr. Excell Seltzer felt that  the patient was stable for discharge.   DISPOSITION:  The patient is discharged home.   DIET:  Asked to maintain low salt/fat/cholesterol diet.   ACTIVITY:  His activity and wound care are per supplemental discharge  sheet.   DISCHARGE INSTRUCTIONS:  1. He was asked to weigh daily.  2. Bring all medications, weights and blood pressure readings to all      appointments.   DISCHARGE MEDICATIONS:  1. He was asked to continue aspirin 325 mg daily.  He received prescriptions for:  1. Captopril 18.75 mg b.i.d.  2. Norvasc 2.5 mg daily.  3. Lasix 40 mg daily.  4. Coreg 6.25 mg b.i.d.  5. Zocor 40 mg nightly.  6. Nitroglycerin 0.4 p.r.n.   FOLLOWUP:  1. He will follow up with Dr. Samule Ohm on 01/29/2006 at 12:00.  2. He was also asked to obtain a primary care physician.  3. Referred to cardiac rehab and advised no smoking or tobacco      products.  4. He will need blood work in approximately 6-8 weeks since Zocor was      initiated.  5. His medications will be titrated as blood pressure allows.   DISCHARGE TIME:  Thirty-five minutes.      Joellyn Rued, PA-C     Veverly Fells. Excell Seltzer, MD   Electronically Signed    EW/MEDQ  D:  01/18/2006  T:  01/18/2006  Job:  [id number]   cc:   Dr. Maye Hides  Salvadore Farber, MD

## 2010-06-10 NOTE — Assessment & Plan Note (Signed)
[facility]                            CARDIOLOGY OFFICE NOTE   NAME:[name], [name]                         MRN:          [id number]  DATE:01/29/2006                            DOB:          Mar 15, 1960    PRIMARY CARE PHYSICIAN:  None, the patient is in the process of  obtaining one.   HISTORY OF PRESENT ILLNESS:  [name] is a 50 year old gentleman who  suffered non-ST elevation myocardial infarction in October 2000, and  then inferior myocardial infarction in October 2003 and again 2004. He  underwent coronary artery bypass grafting in June of 2004. He failed to  show up for subsequent office visit. He was hospitalized in late  December of this year with having been off all medicines except aspirin  for about 18 months. He says this due to loss of insurance coverage at  his job. That coverage has since been reinstated including coverage for  prescription medications. While in hospital he was found to be  profoundly hypertensive and in diastolic heart failure. Echocardiogram  demonstrated EF of 50% to 55% without significant valvular abnormality.  He underwent cardiac catheterization, demonstrating occlusion of the  vein graft to the marginal but patency of his other grafts. Medical  therapy was recommended.   Since discharge, [name] has continued to do well. He has not had any  chest discomfort, PND, orthopnea, edema, or exertional dyspnea. Feels  much better than he did prior to going in the hospital. He has been  compliant with his medications.   HABITS:  Continues tobacco abuse.   PHYSICAL EXAMINATION:  He is generally well-appearing in no distress.  Heart rate 76, blood pressure 100/68, and weight of 188 pounds.  He has no jugular venous distension, thyromegaly, or lymphadenopathy.  LUNGS: Clear to auscultation. Respiratory effort is normal.  Sternal incision is well healed and the sternum is stable. He has a  nondisplaced point of  maximal cardiac impulse. There is regular rate and  rhythm without murmur, rub, or gallop.  ABDOMEN: Soft, nondistended, nontender. There is no hepatosplenomegaly.  Bowel sounds are normal.  EXTREMITIES: Warm without edema.   IMPRESSION/RECOMMENDATIONS:  1. Chronic diastolic heart failure: Acute episode clearly resolved      with resumption of medications. Will continue beta blocker and ACE      inhibitor. To encourage compliance, will switch from Captopril to      lisinopril 10 mg per day.  2. Prior myocardial infarction with atherosclerotic coronary disease:      No angina. Continue aspirin, beta blocker.  3. Tobacco abuse: The patient is interested in quitting. We will      prescribe Chantix.  4. Hypercholesterolemia: Continue simvastatin, which was resumed in      hospital.  5. Need for primary care doctor.  6. Hypertension: Nicely controlled on current regimen, we will make no      changes except the switched to the lisinopril.   I will plan on seeing him back in 6 months time.     Salvadore Farber, MD  Electronically Signed  WED/MedQ  DD: 01/29/2006  DT: 01/29/2006  Job #: [id number]

## 2010-06-10 NOTE — Discharge Summary (Signed)
NAMEEMIT, KUENZEL NO.:  [id number]   MEDICAL RECORD NO.:  [id number]                   PATIENT TYPE:  IPS   LOCATION:  0505                                 FACILITY:  [facility]   PHYSICIAN:  Geoffery Lyons, M.D.                   DATE OF BIRTH:  13-Aug-1960   DATE OF ADMISSION:  08/25/2002  DATE OF DISCHARGE:  08/31/2002                                 DISCHARGE SUMMARY   CHIEF COMPLAINT AND PRESENT ILLNESS:  This was the first admission to [facility] for this 50 year old separated [place]en male voluntarily  admitted.  History of depression with suicidal ideation.  No will to live.  Recently separated from his wife.  Nothing to live for.  Recent bypass  surgery.  Not working.  Has not seen the stepchildren.  Sleeping 1-2 hours a  night.  Appetite fair.  Abusing cocaine.  Stopped taking medication.   PAST PSYCHIATRIC HISTORY:  First time at [facility].  Has been at [facility]  due to cocaine and depression 15 years prior to this admission.   ALCOHOL/DRUG HISTORY:  The last six months with no alcohol, abusing cocaine.   PAST MEDICAL HISTORY:  Arterial hypertension, myocardial infarction on  October 3rd.   MEDICATIONS:  Altace 5 mg daily, Lopid 600 mg twice a day,  hydrochlorothiazide 12.5 mg daily, Plavix 75 mg daily.   PHYSICAL EXAMINATION:  Performed and failed to show any acute findings.   MENTAL STATUS EXAM:  Alert, middle-aged male.  Alert, cooperative, fair eye  contact.  Speech clear.  Normal tempo and production.  Mood depressed.  Affect teary-eyed.  Thought processes coherent.  No evidence of psychosis.  Cognition well-preserved.   ADMISSION DIAGNOSES:   AXIS I:  1. Major depression.  2. Cocaine abuse.   AXIS II:  No diagnosis.   AXIS III:  1. Status post bypass surgery.  2. Myocardial infarction.   AXIS IV:  Moderate.   AXIS V:  Global Assessment of Functioning upon admission 30; highest Global  Assessment of  Functioning in the last year 65.   LABORATORY DATA:  CBC within normal limits.  Blood chemistry within normal  limits.  Initially hypokalemic.  Potassium was replaced.  Liver profile  within normal limits.  Triglycerides 208.  Urine drug screen positive for  cocaine.   HOSPITAL COURSE:  He was admitted and started intensive individual and group  psychotherapy.  He was placed on his Nitrostat, Altace 5 mg daily, Ambien 10  mg for sleep, Restoril 15 mg, K-Dur 20 mEq now, Lexapro 5 mg in the morning,  Protonix 40 mg daily, Lexapro was increased to 10 mg.  Medication had to be  readjusted.  Altace was increased to 10 mg per day.  He was placed on Toprol  XL 50 mg per day.  Admitted to multiple losses, still very depressed,  suicidal ruminations.  Used cocaine to escape, to die.  Has had multiple  loss of health, status post three myocardial infarctions, status post open  heart surgery, relationship, alienated from his 51 year old son, separated  from his wife, got himself out of church, no friends.  Continued to evidence  depressed mood and affect.  Sense of hopelessness and helplessness.  The son  was willing to come the next day for a family session.  Felt better.  His  gastrointestinal symptoms got better.  The son said that he was going to be  able to stay with him.  The interview helped with the medical management.  There was a family session with the son and his wife.  Initially, he felt  that the son was going to let him stay with him but later on found out that  this was not going to happen.  Became very numb.  There was a family session  with the sister and the mother.  The plan, then, was for him to go to a  halfway house.  On August 31, 2002, he was in full contact with reality.  He  was going to stay with family.  Eventually planned on going to halfway house  but he felt supported by his family.  There was no suicidal ideation, no  homicidal ideation, had done a lot of grief work.   Committed to abstinence  and to follow up on an outpatient basis.   DISCHARGE DIAGNOSES:   AXIS I:  1. Major depression, recurrent.  2. Cocaine abuse.   AXIS II:  No diagnosis.   AXIS III:  1. Status post bypass surgery.  2. Status post three myocardial infarctions.  3. Hypercholesterolemia.   AXIS IV:  Moderate.   AXIS V:  Global Assessment of Functioning upon discharge 50-55.   DISCHARGE MEDICATIONS:  1. K-Dur 20 mEq daily.  2. Toprol XL 50 mg in the morning.  3. Restoril 15 mg at bedtime.  4. Ambien 10 mg.  5. Protonix 40 mg daily.  6. Hydrochlorothiazide 12.5 mg.  7.     Lexapro 10 mg per day.  8. Altace 10 mg daily.   FOLLOW UP:  Arbutus Ped.                                               Geoffery Lyons, M.D.    IL/MEDQ  D:  09/24/2002  T:  09/26/2002  Job:  [id number]

## 2010-06-10 NOTE — Cardiovascular Report (Signed)
[name], [name] NO.:  [id number]   MEDICAL RECORD NO.:  [id number]                   PATIENT TYPE:  INP   LOCATION:  1824                                 FACILITY:  [facility]   PHYSICIAN:  Charlies Constable, M.D.                  DATE OF BIRTH:  1960-03-05   DATE OF PROCEDURE:  07/07/2002  DATE OF DISCHARGE:                              CARDIAC CATHETERIZATION   PROCEDURES PERFORMED:  1. Cardiac catheterization.  2. Percutaneous coronary intervention.   CARDIOLOGIST:  Charlies Constable, M.D.   CLINICAL HISTORY:  [name] is 50 years old and nine months ago had two  overlapping CYPHER stents placed in the circumflex artery crossing an AV  branch.  He did fairly well since that time until this morning when he  developed recurrent chest pain and presented to the emergency room with  prolonged chest pain.  His initial troponins and CK MB were positive.  His  EKG did not show any acute changes.  He was taken to the cath lab for an  emergent catheterization.   DESCRIPTION OF PROCEDURE:  The procedure was performed via the right femoral  artery using an arterial sheath and 6 French preformed coronary catheters.  A femoral arterial punch was performed and Omnipaque contrast was used.  After completion of the diagnostic study we made the decision to proceed  with intervention on the in-stent thrombosis in the marginal branches of the  circumflex artery.   The patient was given weight-adjusted heparin following an ACT of greater  than 200 seconds and was given double bolus Integrelin infusion.  I do not  believe he received Plavix.  We used a 3.0 7 [place] guiding catheter  with side holes.  We were able to cross the lesion in the circumflex artery  with the wire without too much difficulty.  This did not reestablish flow.  We used an Export catheter and performed aspiration thrombectomy on the  lesion within the stent.  This removed a moderate amount of  thrombus and  reestablished TIMI III flow.  There still was severe disease within the  stent and just distal to the stent as well as significant disease in the  ostium.  We next went in with a 2.0 x 20 Maverick and performed two  inflations up to eight atmospheres for 30 seconds.  This reestablished flow.   We then performed intravascular ultrasound to see if the stent was deployed.  This showed a distal vessel size of 2.0 x 2.0.  There was large a amount of  new intimal ingrowth within the stent diffusely throughout most of the  stent; however, the proximal part was somewhat spared.  The stent did appear  to be fully deployed and was 2.2 in its distal portion an 3.25 in its  proximal portion.  We then used a 2.5 x 50 mm cutting  balloon and performed  multiple inflations up and down within the stent.  We never went outside the  stent.  This resulted in occlusion of the marginal branch and some  compromise in the AV branch.  It was appeared that there was a dissection  within the stent.  We then performed multiple balloon inflations with a long  2.5 x 30 mm Maverick balloon, but was unable to improve the appearance, and  the flow was still reduced.  At this point we felt the only way to salvage  this was to put a new stent within the old stent.   With he use of 2.75 x 32 mm TAXUS and deployed this with one inflations at  12 atmospheres for 30 seconds. Following inflation of the stent was had no  flow down the marginal branch and we also had no flow down the AV branch.  We attempted to re-balloon this with a 2.5 x 30 mm, bu were unable to  establish flow.  We also passed Surpass perfusion balloon down the marginal  branch, but we again were unable to established distal flow.   At this point we did not feel that we could salvage the vessel. I consulted  with Dr. Laneta Simmers and Dr. Riley Kill about the possibility of undergoing bypass  surgery.  Distal vessels were not extremely large, but the  patient has been  having ongoing pain and he does have significant disease in the right  coronary artery, which could require revascularization.  We decided together  that emergent bypass surgery was the best option.   We removed the guiding catheter and did a distal aortogram, and then placed  an intra-aortic balloon pump (30 mL Data) in the aorta.   The patient was then transferred to surgery for emergent bypass surgery.   CONCLUSION:  1. Coronary artery disease status post prior stenting of the circumflex and     circumflex marginal vessel nine months  ago with 70% ostial narrowing in     the circumflex artery, 90% narrowing in the first marginal branch of the     circumflex, complete occlusion within the stent in the second marginal     branch of the circumflex artery, 50-70% in the first diagonal and 70%     stenosis in the third diagonal branch of the left anterior descending     with irregularities in the left anterior descending, 70% mid and 70%     distal stenosis in the right coronary artery with 80% stenosis in the     posterior descending branch, and inferior and inferolateral wall     hypokinesis with an estimated ejection fraction of 50-55%.  2. Unsuccessful attempt at percutaneous coronary intervention of the     circumflex marginal vessel  with total occlusion of the vessel at the     beginning of the procedure and total occlusion of the circumflex vessel     at the end of the procedure, but now involving the arteriovenous groove     as well as the marginal branch.   DISPOSITION:  The patient was taken for emergent bypass surgery.                                                 Charlies Constable, M.D.    BB/MEDQ  D:  07/07/2002  T:  07/07/2002  Job:  [id number]  Elvina Sidle, M.D.  [address] [place]  [place] [address]  Fax: [phone]   Salvadore Farber, M.D.   Cardiopulmonary Laboratory   cc:   Elvina Sidle, M.D. [address] [place]  [place] [address]  Fax: [phone]   Salvadore Farber, M.D.   Cardiopulmonary Laboratory

## 2010-06-10 NOTE — Cardiovascular Report (Signed)
[facility]  Patient:    [name], [name] Visit Number: [id number] MRN: [id number]          Service Type: MED Location: [phone] Attending Physician:  Daisey Must Dictated by:   Rollene Rotunda, M.D. [facility] Proc. Date: 12/03/00 Admit Date:  11/30/2000   CC:         Elvina Sidle, M.D., [facility]   Cardiac Catheterization  DATE OF BIRTH: 02-Jun-1960  PRIMARY PHYSICIAN: Elvina Sidle, M.D.  PROCEDURE: Left heart cardiac catheterization/coronary arteriography.  INDICATIONS: Evaluate patient with chest pain suggestive of unstable angina.  DESCRIPTION OF PROCEDURE: Left heart catheterization was performed via the right femoral artery.  The artery was cannulated using an anterior wall puncture.  A #6 French arterial sheath was inserted via the modified Seldinger technique.  Preformed Judkins and a pigtail catheter were utilized.  The patient tolerated the procedure well and left the lab in stable condition.  RESULTS:  HEMODYNAMICS: LV 163/4, AO 163/85.  CORONARY ARTERIOGRAPHY: Left main: The left main coronary artery was normal.  Left anterior descending: The LAD had a proximal 25% stenosis. The first diagonal was large with a long proximal 60% stenosis. The second diagonal was very small with luminal irregularities. The third diagonal was small with 50% proximal stenosis.  Circumflex: The circumflex had 25% ostial stenosis. The AV groove had luminal irregularities. The first obtuse marginal was very small and slender. The second obtuse marginal had long proximal 70% stenosis. The third obtuse marginal was moderate in size and had 75% stenosis in the mid vessel.  Right coronary artery: The right coronary artery was dominant. It was a very large vessel. 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 The PDA was a narrow vessel with proximal 70% stenosis and mid 60% stenosis.  LEFT VENTRICULOGRAM: The left  ventriculogram was obtained in the RAO projection. The EF was 55% with normal wall motion. There is a question of mild to moderate mitral regurgitation.  CONCLUSION: Nonobstructive coronary artery disease with the anatomy essentially unchanged from the catheterization in October of 2000.  Well preserved left ventricular function.  PLAN: The patient will continue to have secondary risk factor modification. If he has chest pain he will be followed by his primary care physician for evaluation of possible nonanginal etiology. His pain could be related to small vessel coronary disease if no other etiology is identified. He will have an echocardiogram to evaluate the extent of his mitral regurgitation. Dictated by:   Rollene Rotunda, M.D. [facility] Attending Physician:  Daisey Must DD:  12/03/00 TD:  12/03/00 Job: [id number]

## 2010-06-10 NOTE — H&P (Signed)
NAMEDWAN, HEMMELGARN NO.:  [id number]   MEDICAL RECORD NO.:  [id number]          PATIENT TYPE:  INP   LOCATION:  2919                         FACILITY:  [facility]   PHYSICIAN:  Bevelyn Buckles. Bensimhon, MDDATE OF BIRTH:  1960/11/20   DATE OF ADMISSION:  01/14/2006  DATE OF DISCHARGE:                              HISTORY & PHYSICAL   PRIMARY CARE PHYSICIAN:  Dr. Elvina Sidle at [facility].   REASON FOR ADMISSION:  Congestive heart failure, chest pain and  hypertensive crisis.   HISTORY OF PRESENT ILLNESS:  Mr. Ysaguirre a 50 year old male with a history  of known coronary artery disease status post myocardial infarction in  2004 followed by bypass surgery.  Also has a history of hypertension,  hyperlipidemia and COPD with ongoing tobacco use.  At that time of his  catheterization in 2004, he was found to have a three-vessel coronary  artery disease with an EF of 50-55%.  He underwent attempted PCI at the  left circumflex which was thought to be the infarct vessel, but this was  unsuccessful, and he was taken for emergent bypass surgery.  Unfortunately, he has been noncompliant with his medications and follow  up.  For the past 18 months, he has only been taking aspirin.  For the  past 2 weeks, he has a history of increasing shortness of breath,  orthopnea and PND as well as an 8-pound weight gain.  Also now reports a  two-day history of chest pain going to his left shoulder which is worse  when coughing.  [facility] today who referred him to  the ER.  Chest x-ray showed CHF.  BNP was 1173.  EKG showed sinus tach  with mild ST depression laterally which was unchanged from previous.   REVIEW OF SYSTEMS:  He does note some chronic arthritic pain as well as  a cough productive of clear sputum and occasional chills.  No fevers, no  melena, no bright red blood per rectum.  He does have a history of  depression with previous  hospitalization for it.  Remainder of the  review of systems is negative except for HPI and problem list.   PROBLEM LIST:  1. Coronary artery disease. Status post myocardial infarction in 2004      with subsequent three-vessel coronary artery bypass grafting.      Ejection fraction 50-55%.  2. Hypertension.  3. Hyperlipidemia.  4. Chronic obstructive pulmonary disease with ongoing tobacco use.  5. Depression   CURRENT MEDICATIONS:  Aspirin 325 a day.   ALLERGIES:  No known drug allergies.   SOCIAL HISTORY:  He lives in [place].  He is a Curator.  He has one  child.  Been smoking for over 30 years now a half pack a day.  Remote  history of cocaine and marijuana use.   FAMILY HISTORY:  His mom is alive with a history of strokes and  hypertension.  Father died at age 41 with history of coronary disease  status post bypass.  His has two sisters, one with  epilepsy and one with  hypertension.   PHYSICAL EXAMINATION:  He is somewhat disheveled and sitting up on the  side of the bed in a moderate amount of respiratory distress.  He is  using accessory muscles to breathe.  Blood pressure is 189/120, heart  rate is 105; he is saturating 98% on 3 liters.  HEENT:  Sclerae anicteric.  EOMI.  No xanthelasmas.  Mucous membranes  are moist.  Oropharynx is clear.  Dentition is poor.  Neck is supple and  thick.  JVP appears elevated to at least the jaw.  Carotid are 2+  bilaterally without any obvious bruits.  There is no lymphadenopathy or  thyromegaly.  CARDIAC:  He has distant heart sounds.  He is tachycardiac and regular.  There is 2/6 holosystolic murmur at the apex consistent with mitral  regurgitation.  Question soft gallop.  LUNGS:  Have poor air movement throughout, mild basilar crackles.  ABDOMEN:  Obese, nontender, mildly distended.  No hepatosplenomegaly.  No bruits.  No masses appreciated.  EXTREMITIES:  Warm with no cyanosis or clubbing or edema.  DP pulses are  2+  bilaterally.  There are no rashes or arthropathies.  NEUROLOGICAL:  He is alert and x3.  Cranial II-XII are intact.  Moves  all 4 extremities without difficulty.  Affect is appropriate.   EKG shows sinus tachycardia at a rate of 105.  There is some mild ST  depression and T-wave inversion laterally which is old.  Inferior Q-  waves.  Chest x-ray shows congestive heart failure.  Labs show a point  of care troponin less than 0.05.  CK-MB of 2.9.  Sodium 139, potassium  4.1, BUN of 9, creatinine 1.0.  Kristiansen count 10.7, hemoglobin 13.3, D-  dimer 1.05, platelets 231.  BNP is 1173.   ASSESSMENT:  1. Acute CHF with hypertensive crisis.  2. Chest pain, possible unstable angina.  3. Mitral regurgitation.  4. Coronary artery disease status post coronary artery bypass grafting      in June 2004.  5. Chronic obstructive pulmonary disease with ongoing tobacco use.  6. History of cocaine use.   Currently, we will admit him for diuresis and check a 2-D  echocardiogram.  He will need a rule out myocardial infarction.  We will  treat his blood pressure aggressively with IV nitroglycerin and  hydralazine, start ACE inhibitor.  We will also start heparin.  I will  check a urine drug screen.  Probable cardiac catheterization as  respiratory status improves.      Bevelyn Buckles. Bensimhon, MD  Electronically Signed     DRB/MEDQ  D:  01/14/2006  T:  01/15/2006  Job:  [id number]   cc:   Elvina Sidle, M.D.

## 2010-06-10 NOTE — Discharge Summary (Signed)
[name], SHIMABUKURO NO.:  [id number]   MEDICAL RECORD NO.:  [id number]                   PATIENT TYPE:  INP   LOCATION:  4741                                 FACILITY:  [facility]   PHYSICIAN:  Donnetta Hutching, M.D.                    DATE OF BIRTH:  Nov 02, 1960   DATE OF ADMISSION:  11/16/2001  DATE OF DISCHARGE:  11/19/2001                           DISCHARGE SUMMARY - REFERRING   PROCEDURES:  1. Cardiac catheterization.  2. Coronary arteriogram.  3. Left ventriculogram.  4. Cypher stent x2 to one vessel.   HOSPITAL COURSE:  The patient is a 50 year old male who had a cardiac  catheterization in 2002 for which medical therapy was recommended.  He had  stuttering substernal chest pain with radiation to his left arm and  shortness of breath the night before admission.  The pain decreased with  sublingual nitroglycerin and it increased dramatically on the day of  admission so he came to the emergency room.  He was admitted for urgent  cardiac catheterization.   The cardiac catheterization showed a normal left main and an LAD with 30%  stenosis and diagonal with 40% stenosis.  The circumflex had a 60% distal  lesion and the OM1 had a 90% stenosis that was old.  The OM2 had an 80%  stenosis and a 90% stenosis that was new.  He received two Cypher stents to  these lesions reducing the stenosis from 80 to 0 and from 95 to less than  10%.  The RCA had 30, 40, and 50% lesions as well as a 70% distal stenosis  and a 60% in the PLA.  His EF was 79% without regional wall motion  abnormalities.  He tolerated the procedure well.  He was placed on aspirin  indefinitely and Plavix for three months.   The patient's sheath was removed without difficulty and he had no further  episodes of chest pain.  He did have some musculoskeletal pain with deep  inspiration but this did not seem cardiac in origin.  He was seen by cardiac  rehab and educated as far as risk factor  reduction, use of nitroglycerin and  calling 911.  He was referred for phase II.   The patient has a history of tobacco use and smoking cessation was strongly  recommended.  He was encouraged to quit and seems motivated.   His lipids were checked and his total cholesterol was 172 but his  triglycerides were 242 and his HDL was 30 already on Lipitor 10 mg q.d.  This was reviewed by Dr. Samule Ohm, who added Lopid  600 mg p.o. b.i.d.  He  will get lipid and cholesterol testing as an outpatient in six weeks.  The  patient ruled in for an MI with a peak enzyme of 586/76.0 and troponin  of  6.43.  However, his enzymes decreased rapidly  and the last checked was  219/15.9.  This was on November 18, 2001.  It was felt that by November 19, 2001, his enzymes would be back to normal.  His Angeletti count was slightly  elevated at 12.2 but this was felt secondary to MI and the patient had no  upper respiratory infection on his chest x-ray and no dysuria.  He also had  an elevated blood glucose level of 122 but hemoglobin A1C was checked and  was within normal limits so it was felt secondary to a stress response and  not an indication for diabetes.   By November 19, 2001, the patient was ambulating without difficulty or chest  pain and considered stable for discharge with outpatient follow-up arranged.   LABORATORY DATA:  Hemoglobin 14.8, hematocrit 43.4, wbc 12.2, platelets 221.  Sodium 139, potassium 3.7, chloride 104, CO2 26, BUN 9, creatinine 0.8,  glucose 97.  TSH 1.631.  Total cholesterol 172, triglycerides 242, HDL 30,  LDL 94.  Hemoglobin A1C 4.8.   Chest x-ray showed the lungs are clear.  There was no evidence of acute  focal opacity or pneumothorax.   CONDITION ON DISCHARGE:  Improved.   DISCHARGE DIAGNOSES:  1. Acute myocardial infarction, status post Cypher stent x2 to the     circumflex this admission.  2. Residual coronary artery disease in the right coronary artery, follow-up      exercise treadmill test per Dr. Samule Ohm.  3. Hypertension.  4. Tobacco use.  5. Family history of coronary artery disease.  6. Hyperlipidemia.   DISCHARGE INSTRUCTIONS:  1. His activity level is to include no driving for a week and no sexual or     strenuous activity until cleared by M.D.  2. He is to call the office for bleeding, swelling or drainage of the     catheterization site.  3. He is to follow up with cholesterol and liver testing in six weeks.  4. He is to follow up with Dr. Faustino Congress as scheduled.  5. He is to follow up with Dr. Samule Ohm on December 12, 2001, at 10:15 a.m.   DISCHARGE MEDICATIONS:  1. Hydrochlorothiazide 25 mg one half tablet q.d.  2. Lopid 600 mg b.i.d.  3. Toprol XL 200 mg q.d.  4. Lipitor 10 mg q.d.  5. Altace 10 mg q.d.  6. Norvasc 10 mg q.d.  7. Nitroglycerin p.r.n.  8. Plavix 75 mg q.d.  9. Aspirin 325 mg q.d.     Lavella Hammock, P.A. [facility]                  Donnetta Hutching, M.D.    RG/MEDQ  D:  11/19/2001  T:  11/19/2001  Job:  [id number]   cc:   Dr. Sim Boast [facility]   Salvadore Farber, M.D. [facility]  [address] [place], [place] [address]  Fax: [phone]

## 2010-06-10 NOTE — Cardiovascular Report (Signed)
[name], [name] NO.:  [id number]   MEDICAL RECORD NO.:  [id number]                   PATIENT TYPE:  INP   LOCATION:  2922                                 FACILITY:  [facility]   PHYSICIAN:  Salvadore Farber, M.D. [facility]         DATE OF BIRTH:  1960-04-03   DATE OF PROCEDURE:  11/16/2001  DATE OF DISCHARGE:                              CARDIAC CATHETERIZATION   PROCEDURES:  Coronary angiography, left ventriculography, left heart  catheterization, stent to obtuse marginal #2 with kissing balloon  angioplasty of the mid circumflex.   INDICATIONS:  The patient is a 50 year old gentleman with previously  diagnosed coronary artery disease, which has been managed medically.  Unfortunately, he has continued to smoke.  He presents this evening with  stuttering chest pain for 16 hours becoming constant two hours prior to  presentation. He presented to the emergency room with ongoing 8/10 chest  pain and inferior ST elevations.  With the institution of aspirin, heparin,  and ReoPro in the emergency room, his pain improved to approximately 1/10.  He was brought to the catheterization lab for diagnostic angiography and  probable PCI.   DIAGNOSTIC TECHNIQUE:  Informed consent was obtained. Under 1% lidocaine  local anesthesia, a 7 French sheath was placed in the right femoral artery  using the modified Seldinger technique.  Diagnostic angiography was  performed using JL4, JR4, and pigtail catheters.  Ventriculography was  performed in both the LAO and RAO projections by power injection.   DIAGNOSTIC FINDINGS:  1. EF equals 79% without regional wall motion abnormality on both LAO and     RAO ventriculography.  2. No aortic stenosis or mitral regurgitation.  3. LV:  125/1/8.  4. Left main:  Angiographically normal.  5. LAD:  The LAD is a large vessel giving rise to three diagonal branches.     The first diagonal branch has approximately 40% stenosis  proximally.     There is no other significant disease.  6. Circumflex:  The circumflex is a large vessel giving rise to three obtuse     marginal branches.  The first obtuse marginal branch is long, but small     diameter, diffusely diseased vessel. There is a 90% stenosis proximally.     The mid circumflex just proximal to the takeoff of OM-2 has an 80%     stenosis which extends into the large OM-2 branch.  Further downstream,     there is a 95% stenosis of OM-2.  7. RCA:  The RCA is a large, diffusely diseased vessel. The proximal and mid     vessel has multiple plaques of up to 50%.  There is a 70% stenosis of the     distal vessel just proximal to the bifurcation of the PDA.  The ostial     portion of the PDA has a 60% stenosis.   DIAGNOSTIC IMPRESSION:  The patient  has diffuse disease of both the  circumflex and right coronary artery distributions.  Electrocardiogram was  most suggestive of a right coronary artery lesion.  While new compared with  his prior study, is completely without haze to suggest thrombosis. The 95%  stenosis of the large obtuse marginal #2 is also new compared with his prior  study. The more proximal lesion in this vessel is old.  Given the severity  of this lesion, I get most likely that this is the culprit, though I  acknowledge that it is somewhere at odds with the electrocardiographic  finding.  I therefore proceeded to stenting of obtuse marginal #2.   PERCUTANEOUS CORONARY INTERVENTION TECHNIQUE:  Additional heparin was given  to achieve an ACT of 250 seconds.  ReoPro was continued.  After being unable  to seat a Voda 3.5 guide, a Judkins left 4 guide was advanced over a wire  and engaged in the ostium of the left main coronary artery.  A BMW wire was  advanced into the distal portion of OM-2 without difficulty.  The lesion of  both the mid circumflex and OM-2 was pre-dilated using a 2.5 x 30 mm  Maverick at 6 atmospheres.  A 3.0 x 33 mm Cypher was then  positioned across  the lesion and deployed at 12 atmospheres.  This inflation resulted in a  distal edge dissection.  Therefore, a second stent was positioned to overlap  the distal portion of this and cover the dissection.  This 2.5 x 13 mm  Cypher was deployed at 12 atmospheres.  The distal portion of this more  distal stent was then post-dilated using a 2.5 x 12 mm Quantum balloon at 12  atmospheres.  The remainder of both stents was then post-dilated using a  3.25 x 20 mm Quantum and several inflations to 18 to 20 atmospheres over the  course of the stent.  Despite these inflations, there remained approximately  10% residual stenosis at the site of the original lesion. After this post-  dilatation, the jailed AV groove circumflex lesion had progressed from  approximately 70% to 90%.  Therefore, I felt necessary to rescue this  substantial territory.  It did not respond to the intracoronary  nitroglycerin.  Therefore, a PT Graphix wire was manipulated through the  site of the stent and into the AV groove circumflex.  The circumflex was  dilated using a 2.0 x 9 mm Maverick at 6 atmospheres.  Kissing balloon  angioplasty of the circumflex OM-2 bifurcation was then performed using the  2.0 mm Maverick in the circumflex and a 3.0 x 15 mm Maverick in the  circumflex extending into OM-2.  Both balloons were simultaneously inflated  to 6 atmospheres.  Final angiograms demonstrated less than 10% residual  stenosis in the mid circumflex and OM-2 with TIMI-3 flow.  The jailed  portion of the AV groove circumflex, which was rescued with kissing balloon  angioplasty had approximately 20% residual stenosis with TIMI-3 flow.   IMPRESSION/PLAN:  There was successful stenting of the proximal culprit  lesion of obtuse marginal #2 with kissing balloon angioplasty of the jailed  arteriovenous groove circumflex.  We will plan aggressive medical antianginal therapy for his moderate right coronary artery  disease and the  long-standing severe disease of his first obtuse marginal branch.  We will  plan for an exercise tolerance test with nuclear imaging to be performed in  approximately one month to better assess the right coronary artery lesion.  The obtuse  marginal #1 lesion is not amenable to revascularization due to  the diffuse disease and small diameter.   He will be continued on aspirin indefinitely.  Plavix will be continued for  a minimum of nine months.  ReoPro will be continued for 12 hours. The  sheaths will be removed when his ACT is less than 150 seconds.  Serial  cardiac enzymes will be cycled.                                                  Salvadore Farber, M.D. [facility]    WED/MEDQ  D:  11/16/2001  T:  11/18/2001  Job:  [id number]

## 2010-06-10 NOTE — H&P (Signed)
NAMEJENO, CALLEROS NO.:  [id number]   MEDICAL RECORD NO.:  [id number]                   PATIENT TYPE:  INP   LOCATION:  1823                                 FACILITY:  [facility]   PHYSICIAN:  Salvadore Farber, M.D. [facility]         DATE OF BIRTH:  [birth year]   DATE OF ADMISSION:  11/16/2001  DATE OF DISCHARGE:                                HISTORY & PHYSICAL   PRIMARY CARDIOLOGIST:  Carole Binning, M.D. [facility]   PRIMARY MEDICAL DOCTOR:  Elvina Sidle, M.D. at [facility].   CHIEF COMPLAINT:  Chest pain.   HISTORY OF PRESENT ILLNESS:  The patient is a 50 year old [place] male who  presents after having had shuttering chest pain overnight.  The chest pain  is described as substernal pain with radiation into his neck and left arm,  associated with shortness of breath and left arm tingling at approximately 3  a.m. while at work.  He works the nightshift as a Curator.  He took 2  sublingual Nitroglycerin's and this relieved his pain.  He went home this  morning, went to sleep and awoke at approximately 2-3 p.m. this afternoon  with a recurrence of chest pain.  He proceeded to take a bath and he says  that a horse kicked him in the chest at approximately 4 p.m.  This chest  pain was more severe in nature, was graded at 8/10, and associated with  diaphoresis and shortness of breath.  He proceeded to the emergency room at  this time.   PAST MEDICAL HISTORY:  1. Coronary artery disease.     a. Cath in 2000 showed ejection fraction of 55% with apical and        posterolateral hypokinesis, a 25% proximal left anterior descending,        60% left anterior descending, 50% D1, osteal minor arrange of        circumflex, 90% first obtuse marginal artery, 75% second obtuse        marginal artery, 75% third obtuse marginal and a 75% distal right        coronary artery.     b. Catheterization in 11/2000 showed an ejection fraction of 55%, no wall  motion abnormalities.  Left main was normal.  Circumflex showed minor        irregularities and a 25% osteal lesion.  The patient had a proximal        70% second obtuse marginal artery and a mid 75% third obtuse marginal        artery.  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  2. Hypertension.  3. Tobacco use.  4. Hyperlipidemia.   CURRENT MEDICATIONS:  Nitroglycerin p.r.n., Toprol XL 200 mg q.d., Lipitor  10 mg q.d., Altace 10 mg q.d., Norvasc 10  mg q.d., Hydrochlorothiazide 25 mg  q.d.   ALLERGIES:  NO KNOWN DRUG ALLERGIES.   SOCIAL HISTORY:  The patient lives in [place].  He is a Curator.  He has  one child.  He has a 30 pack year history of smoking.  He has remote cocaine  use and a history of marijuana use.   FAMILY HISTORY:  His mother is living.  His father died of heart disease and  congestive heart failure.   OTHER REVIEW OF SYSTEMS:  Negative.   PHYSICAL EXAMINATION:  VITAL SIGNS:  The patient is a afebrile with a pulse  of 68, respirations 20, blood pressure 178/103, but this decreased to 132/84  with treatment of a Nitroglycerin drip.  GENERAL:  The patient is a Protzman male in no apparent distress.  HEENT:  Shows normocephalic, atraumatic.  Pupils are equal, round, reactive  to light and accommodation, extraocular muscles are intact.  No erythema or  exudate.  NECK:  Supple.  No bruits, no jugular venous distention, no lymphadenopathy.  CARDIOVASCULAR:  Regular rate and rhythm.  No murmurs, rubs or gallops.  No  S3 or S4.  Normal S1 and S2.  PMI is within normal limits.  LUNGS:  Clear to auscultation bilaterally.  No wheezes, rales or rhonchi.  SKIN:  Within normal limits.  ABDOMEN:  Soft, nontender, nondistended, normal bowel sounds.  No rebound or  guarding.  No hepatosplenomegaly.  EXTREMITIES:  No cyanosis, clubbing or edema.  2+ pulses throughout.  No  femoral bruits.   NEUROLOGIC:  Intact.   LABORATORY DATA:  Chest x-ray is pending.  Electrocardiogram shows a rate of  58, rhythm, normal sinus and normal axis with 2 mm of ST elevation  inferiorly in leads 2, 3 and AVF.  He has slight ST depression in V1 and V2.   Labs show a Trautman count of 10.1, hematocrit 43.7, platelets 316.  Sodium  139, potassium 3.8, chloride 107, bicarb 26, BUN 8, glucose 110.  PTT 38,  INR 0.9.  Other labs are currently pending.   ASSESSMENT/PLAN:  The patient is a 50 year old gentleman who presents with  an acute myocardial infarction based on recent onset of chest pain and ST  elevation inferiorly.  Symptoms of shuttering pain began at 3 a.m., but this  was decreased by sublingual Nitroglycerin.  Pain recurred at 3-4 p.m. today.  Based on his clinical findings we will take him to the cath lab for possible  percutaneous intervention.  In the emergency room, the patient received  aspirin, Nitroglycerin and Heparin.  He will need further treatment with  beta blocker and angiotension-converting enzyme inhibitor, instruction on  smoking cessation, lipid check, hypertension management and urine drug  screen analysis.     Heloise Beecham, M.D. [facility]                Salvadore Farber, M.D. [facility]    DWM/MEDQ  D:  11/16/2001  T:  11/18/2001  Job:  [id number]

## 2010-06-10 NOTE — Op Note (Signed)
Carl, PURNELL NO.:  Perez   MEDICAL RECORD NO.:  [id number]                   PATIENT TYPE:  INP   LOCATION:  2309                                 FACILITY:  [facility]   PHYSICIAN:  Evelene Croon, M.D.                  DATE OF BIRTH:  March 10, 1960   DATE OF PROCEDURE:  07/07/2002  DATE OF DISCHARGE:  07/07/2002                                 OPERATIVE REPORT   PREOPERATIVE DIAGNOSIS:  Severe three vessel coronary artery disease status  post failed left circumflex percutaneous intervention.   POSTOPERATIVE DIAGNOSIS:  Severe three vessel coronary artery disease status  post failed left circumflex percutaneous intervention.   PROCEDURE:  Emergency median sternotomy, extracorporal circulation, coronary  artery bypass graft surgery x4 using the saphenous vein graft to the first  diagonal branch of the left anterior descending, saphenous vein graft to the  second obtuse marginal branch of the left circumflex coronary artery, and a  sequential saphenous vein graft to the posterior descending and posterior  lateral branch of the right coronary artery.  Endoscopic vein harvesting  from the left leg.   SURGEON:  Alleen Borne, M.D.   ASSISTANT:  Toribio Harbour, N.P.   ANESTHESIA:  General endotracheal.   INDICATIONS:  This patient is a 50 year old [place] male smoker with a history  of hypertension and coronary disease status post myocardial infarction and  percutaneous intervention in October 2003 with a drug-eluting stent placed  in the second obtuse marginal coronary artery.  The patient awoke at 6  o'clock this morning with severe chest pain.  He continued to have chest  pain in the emergency room.  He was taken to the catheterization lab.  This  showed occlusion of the second obtuse marginal branch.  Dr. Juanda Chance was able  to open this vessel, but then it closed again and could not be reopened.  In  addition, the patient had three vessel  coronary disease.  The LAD had mild  proximal narrowing at the take off of the first diagonal branch.  The first  diagonal itself had about 70% ostial stenosis.  There is a small second  diagonal branch that had stenosis at the ostium.  The left circumflex had  about 70% proximal stenosis just after the takeoff of the vessel from the  left main coronary artery.  There was a small first marginal that had 90%  stenosis proximally.  The second marginal was occluded as mentioned above.  The third marginal was a small vessel that was lying high on the lateral  wall and appeared to be barely exiting the AV groove.  The right coronary  artery had 70% mid and distal stenoses and the posterior descending branch  had about 80% proximal stenosis.  Both the PDA and the PLA branches were  small but graftable.  A left ventricular ejection fraction was  about 50%.  I  was called to the catheterization lab to see the patient and he was having  chest pain at that time.  After discussion with Dr. Juanda Chance and Dr. Riley Kill,  it was felt that coronary artery bypass graft surgery was the best treatment  for this patient.   An intra-aortic ballon pump was placed in the catheterization lab due to  ongoing chest pain.  I discussed the operative procedure of coronary artery  bypass graft surgery with the patient and his mother including alternatives,  benefits, and risks including bleeding, possible blood transfusion,  infection, stroke, myocardial infarction, graft __________ and death.  We  also discussed the importance of maximum cardiac risk factor reduction in  this patient with a strong family history of heart disease including  complete smoking cessation.   DESCRIPTION OF PROCEDURE:  The patient was taken to the operating room and  placed on the table in the supine position.  After induction of general  endotracheal anesthesia, a Foley catheter was placed in the bladder using a  sterile technique.  Then the  chest, abdomen and both lower extremities were  prepped and draped in the usual sterile manner.  The chest was entered  through a median sternotomy incision and the pericardium opened in the  midline.  Examination of the heart showed good ventricular contractility.  The ascending aorta had no palpable plaque in it.   Then a segment of greater saphenous vein was harvested form the left leg  using endoscopic vein harvest technique.  This vein was of medium to large  caliber and of good quality.   Then the patient was heparinized, and when an adequate activated clotting  time was achieved, the distal ascending aorta was cannulated using a 20  French aortic cannula for arterial inflow.  Venous outflow was achieved  using the two stage venous cannula through the right atrial appendage.  Antegrade cardioplegia and vent cannula were inserted in the aortic root.   The patient was placed on cardiopulmonary bypass and the distal coronary  arteries identified.  He had diffusely diseased vessels that were relatively  small.  The LAD was visible in its distal portion where it appeared to have  moderate disease present.  The first diagonal was graftable but diffusely  diseased.  The disease extended out to the end of the vessel.  There was an  area in the mid portion that was soft enough to graft.  The second diagonal  was small and nongraftable.  The first marginal was a small diffusely  diseased vessel and not graftable.  The second marginal was intramyocardial  and was located distally just beyond the stents.  Visual inspection showed  that the second obtuse marginal was thrombosed.  The third marginal was a  small diffusely diseased nongraftable vessel.  The right coronary artery  gave off a small posterior descending and a slightly larger posterior  lateral branch both of which were graftable.  Then the aorta was cross-clamped and 500 cc of cold blood antegrade  cardioplegia was administered in  the aortic root with quick arrest of the  heart.  Systemic hypothermia to 20 degrees Centigrade and topical  hypothermia with iced saline was used.  A temperature probe was placed in  the septum and insulating pad in the pericardium.   The first distal anastomosis was performed to the second marginal branch.  This vessel was opened distally beyond the stents.  The vessel was  completely thrombosed.  The  thrombus was removed through the arteriotomy.  A  2 mm Fogarty balloon catheter was inserted into the distal coronary and  withdrawn, and fresh thrombus was removed.  The vessel also appeared  thrombosed proximal to the arteriotomy within the stents, and this was not  thrombectomized.  The internal diameter of the vessel distally was about 1.6  mm.  The conduit used was a segment of greater saphenous vein.  The  anastomosis was performed in an end-to-side manner using continuous 7-0  Prolene suture.  Flow was measured through the graft and was fair.  Another  dose of cardioplegia was given down this vein graft.   The second distal anastomosis was performed in the posterior descending  coronary artery.  The internal diameter was about 1.5 mm.  The conduit used  was a second segment of greater saphenous vein.  The anastomosis was  performed in a sequential side-to-side manner using continuous 7-0 Prolene  suture.  Flow was measured through the graft and was excellent.   A third distal anastomosis was performed to the posterior lateral branch.  The internal diameter was 1.6 mm.  The conduit used was the same segment of  greater saphenous vein.  The anastomosis was performed in a sequential end-  to-side manner using continuous 7-0 Prolene suture.  Flow was measured  through the graft and was excellent. Then another dose of cardioplegia was  given down the vein graft and in the aortic root.   The fourth distal anastomosis was performed to the first diagonal branch.  The internal diameter of  this vessel was about 1.6 mm.  The conduit used was  a third segment of greater saphenous vein.  The anastomosis was performed in  an end-to-side manner using a continuous 7-0 chromic suture.  Flow was  measured through the graft and was excellent.   The patient was rewarmed to 37 degrees Centigrade, and the cross-clamp was  removed at a time of 67 minutes.  There was spontaneous return of sinus  rhythm.   A partial occlusion clamp was placed in the aortic root and three proximal  vein grafts were performed in an end-to-side manner using a continuous 6-0  Prolene suture.  The clamp was removed and vein grafts deaired, and clamps  removed from the them.  The proximal and distal anastomosis appeared  hemostatic and lie of the graft was satisfactory.  Graft markers were placed  around the proximal anastomoses.  Two temporary right ventricular and right atrial pacing wires were placed and brought out through the skin.   When the patient had rewarmed to 37 degrees centigrade, he was weaned from  cardiopulmonary bypass on no inotropic agent with the intra-aortic balloon  pump on 1:1.  Cardiac function appeared good with a cardiac output of 4  liters per minute.  Protamine was given, and the venous and aortic cannula  were removed without difficulty.  Hemostasis was achieved.  The patient was  given a __________ for this case since he was on multiple anti-platelet  agents.  Two chest tubes were placed with two in the posterior pericardium  and one in the anterior mediastinum.  The pericardium was loosely  reapproximated over the heart.  The sternum was closed with #6 stainless  steel wires.  The fascia was closed with continuous #1 Vicryl suture.  The  subcutaneous tissue was closed with a continuous 2-0 Vicryl, and the skin  with 3-0 Vicryl subcuticular closure.  The lower extremity vein harvest site  was closed in layers in a similar manner.  The sponge, needle and instrument  counts were  correct according to the scrub nurse.  Dry sterile dressings  were applied over the incisions, around the chest tubes which were hooked to  Pleuravac suction.  The patient remained hemodynamically stable and was  transported to the SICU in guarded but stable condition.                                               Evelene Croon, M.D.    BB/MEDQ  D:  07/07/2002  T:  07/08/2002  Job:  [id number]   cc:   [facility]   Charlies Constable, M.D.   Cardiac Catheterization Lab

## 2010-06-10 NOTE — Cardiovascular Report (Signed)
[name], [name] NO.:  [id number]   MEDICAL RECORD NO.:  [id number]          PATIENT TYPE:  INP   LOCATION:  2919                         FACILITY:  [facility]   PHYSICIAN:  Veverly Fells. Excell Seltzer, MD  DATE OF BIRTH:  December 03, 1960   DATE OF PROCEDURE:  01/17/2006  DATE OF DISCHARGE:                            CARDIAC CATHETERIZATION   PROCEDURE:  Left heart catheterization, selective coronary angiography,  left ventricular angiography, saphenous vein graft angiography and Star  close of the right femoral artery.   INDICATIONS:  Mr. Balke a 50 year old gentleman who presented on  01/14/2006 with 2 weeks of shortness of breath, orthopnea, PND and left  shoulder pain.  He was treated for congestive heart failure and has  slowly improved.  He has had intermittent left shoulder pain that has  been concerning for possible anginal equivalent.  The patient has  history of known coronary artery disease and is status post prior bypass  surgery back in 2004.  He was subsequently referred for cardiac  catheterization to define his coronary anatomy in the setting of his  acute presentation with congestive heart failure and concern for  unstable angina.   PROCEDURAL DETAILS:  The risks and indications of the procedure were  explained to the patient.  Informed consent was obtained.  The right  groin was prepped, draped and anesthetized with 1% lidocaine.  A 6-  [place] arterial sheath was placed in the right femoral artery using  modified Seldinger technique without difficulty.  Multiple views above  the left and right coronary arteries were taken.  For the left coronary  artery a 6-French JL-4 catheter was used, for the right coronary artery  a 6-French JR-4 catheter was used.  Following selective native vessel  angiography, the JR-4 catheter was used to image the saphenous vein  grafts.  Following saphenous vein graft angiography an angled pigtail  catheter was inserted into the  left ventricle and pressures were  recorded.  A 30 degree right anterior oblique left ventriculogram was  done.  A pullback across the aortic valve was performed.  At the  conclusion of the case, a Star close device was used to seal the right  femoral arteriotomy.   FINDINGS:  Left ventricular pressure 143/0 with an end-diastolic  pressure of 25, aortic pressure 143/85 with a mean of 108.   NATIVE VESSEL ANGIOGRAPHY:  The left main stem has mild luminal  irregularities.  It is otherwise normal.  It bifurcates into the LAD and  left circumflex.  The LAD is a large-caliber vessel that courses down to  the left ventricular apex.  The LAD gives off a large first diagonal  branch that fills via competitive graft flow.  It then gives off a  second and third diagonal branch.  The second diagonal has luminal  irregularities.  The third diagonal branch has 40% proximal stenoses.  Both of these are fairly small vessels.  The LAD proper has a 40%  stenosis at the origin of the first diagonal.  It then has a 30%  stenosis following the third diagonal.  The remainder of the mid and  distal LAD are free of any significant angiographic disease.   Left circumflex has a long segment of stent.  The left circumflex is  occluded in its proximal portion just prior to the stented area.  It  gives off a medium caliber first obtuse marginal that has 50% proximal  stenosis.   The AV groove circumflex fills via collaterals of the right coronary  artery.   The right coronary artery is occluded in its proximal portion.  There is  a 90% stenosis going into an RV marginal branch.  There are bridging  collaterals present.  The right coronary artery supplies collaterals to  the circ distribution.   Saphenous vein graft angiography.  The saphenous vein graft to the first  obtuse marginal branch is occluded.   Saphenous vein graft to sequence to the PDA and posterolateral branch is  widely patent.  It is an  ectatic appearing graft.  The distal vessels  were small diameter and diffusely diseased.   Saphenous vein graft to the first diagonal is widely patent.  The  diagonal is a large-caliber vessel.  It bifurcates into multiple  branches.   Left ventriculography performed on 30 degree right anterior oblique  projection shows a focal inferolateral area of akinesis.  The remainder  of the left ventricular segments are normal.  The estimated left  ventricular ejection fraction is 50%.  The patient has a known history  of mitral regurgitation but that is not well visualized on this study.   ASSESSMENT:  1. Three-vessel native coronary artery disease.  2. Status post coronary bypass surgery with three of four patent      grafts.  3. Mild segmental left ventricular systolic dysfunction with diastolic      dysfunction.   DISCUSSION:  The patient has patent bypass grafts to the right coronary  circulation as well as the first diagonal circulation.  There is  nonobstructive disease throughout a large LAD.  I do not believe there  are hemodynamically significant stenoses in this vessel.  The circumflex  territory appears to be infarcted based on the left ventriculogram.  I  do not believe there is a role for revascularization as the native  circumflex and the vein graft were both occluded.   RECOMMENDATIONS:  The patient will require continued aggressive medical  therapy for his coronary artery disease and congestive heart failure.      Veverly Fells. Excell Seltzer, MD  Electronically Signed     MDC/MEDQ  D:  01/17/2006  T:  01/17/2006  Job:  [id number]

## 2010-06-10 NOTE — H&P (Signed)
[name], [name] NO.:  [id number]   MEDICAL RECORD NO.:  [id number]                   PATIENT TYPE:  INP   LOCATION:  4730                                 FACILITY:  [facility]   PHYSICIAN:  Lorne Skeens, D.O.                   DATE OF BIRTH:  Jun 28, 1960   DATE OF ADMISSION:  06/01/2003  DATE OF DISCHARGE:                                HISTORY & PHYSICAL   CHIEF COMPLAINT:  Chest pain, cough, and shortness of breath.   HISTORY OF PRESENT ILLNESS:  A 50 year old [place] male with a history of  coronary artery disease, status post MI at age 45, status post  catheterization with stenting in October 2003, hyperlipidemia, hypertension,  tobacco abuse, cocaine abuse, depression with recent voluntary admission at  [facility] in September 2004 and progressively increased dyspnea over  the last two weeks, midsternal chest pain radiating to the left shoulder and  jaw that felt tight lasting about one hour.  It felt like previous MI.  He  came to the ER today.  He was relieved with aspirin and nitroglycerin.  He  had cold sweats at the time, was nauseated with his chest pain.  He  complained of a dry cough the last 2-3 weeks with what he thought was  allergic rhinitis.  Yesterday, his sputum changed from none to brown sputum  after a hot bath this morning.  His chest pain is pleuritic in nature, worse  with cough and deep inspiration.  He denies recent exertional chest pain but  does have dyspnea on exertion.  He also has an extensive cocaine history but  denies any recent cocaine use.   REVIEW OF SYSTEMS:  Hot and cold flashes, a 60-pound weight gain in the last  one year.  No recent illness.  GI:  No vomiting or diarrhea.  GENITOURINARY:  No urinary problems.  NEUROLOGIC:  Sinus headaches.   PAST MEDICAL HISTORY:  1. Depression.  2. Hypercholesterolemia.  3. Coronary artery disease, status post MI x4.  4. Tobacco abuse.  5. Depression.  6. Cocaine  abuse.   PAST SURGICAL HISTORY:  1. CABG in June 2004.  2. Right knee surgery.   MEDICATIONS:  1. Enteric-coated aspirin.  2. Toprol XL.  3. Lipitor.  4. Gemfibrozil.  5. Altace.  6. Tylenol Sinus.   ALLERGIES:  No known drug allergies.   SOCIAL HISTORY:  He smokes one-half pack per day with 30+ pack years.  Lives  in [place] with his wife and two step-children.  Works as an Oncologist.  Last cocaine use he states about 3-6 months ago.  Denies any  alcohol use.   FAMILY HISTORY:  Mom living at 43 with CVA's and hypertension.  Father died  at 72 with CABG, heart disease, hypertension, hypercholesterolemia.  He has  two sisters, one with epilepsy and one with hypertension.  OBJECTIVE:  VITAL SIGNS:  Temperature 97.8, pulse 115 down to 79,  respiratory rate 15, blood pressure 111/44 to 134/77, saturating 93% on room  air and 97% on 2 L nasal cannula.  GENERAL:  He is in no acute distress, comfortable now.  HEENT:  Normocephalic, atraumatic.  Pupils react to light.  No rhinorrhea.  Oropharynx injected.  Moist mucous membranes.  CARDIOVASCULAR:  Regular rate and rhythm with a heart rate of 100.  No  murmurs.  2+ peripheral  dorsalis pedis, posterior tibial, and radial  pulses.  No carotid bruits.  LUNGS:  Decreased breath sounds at the left apex.  No wheezing, rales, or  rhonchi.  Nonlabored breathing.  ABDOMEN:  Obese, soft, nontender, and nondistended.  No hepatosplenomegaly.  Normoactive bowel sounds.  No abdominal bruits.  EXTREMITIES:  No edema.  Positive for clubbing.  SKIN:  Mid-sternal incision well healed.  No jaundice, no pallor, no acute  skin tracks.  NEUROLOGIC:  Cranial nerves II-XII grossly intact bilaterally.  GENITOURINARY:  Exam deferred.   LABORATORY DATA:  PT 3, INR 1, PTT 24.  I-STAT 8 shows a sodium of 139,  potassium 3.2, chloride 113, BUN 16, creatinine 1.1, glucose 108.  pH is  7.454, PCO2 35.3, bicarbonate 24.7.  Hemoglobin is 14.3 and  hematocrit 42.  Myoglobin is 110.  CK-MB is 3.  Troponin-I less than 0.05.   EKG shows sinus tachycardia with a heart rate of 104.  T-wave inversion is  present which is new in leads I, II, V3-V6.  Chest x-ray shows bilateral  perihilar airspace disease, question lower lobe pneumonia bilaterally.   ASSESSMENT AND PLAN:  A 50 year old [place] male with coronary artery disease,  hypertension, hyperlipidemia, and tobacco abuse with chest pain, shortness  of breath, and bronchial pneumonia on chest x-ray.   1. Community-acquired bronchial pneumonia.  Risk for pneumonia was     significant, tobacco abuse history.  He was afebrile with little sputum     production, so we will not be able to culture.  Treat with Rocephin and     Zithromax.  Recommend smoking cessation.  Check an HIV due to his social     history.  2. Chest pain and shortness of breath, significant coronary artery disease     history.  Chest pain sounds more pleuritic by history.  No recent anginal     chest pain.  Change of EKG on T-wave inversion.  We will recheck now.     Admit to telemetry and check cardiac markers.  Call cardiology if there     is an abnormality.  Shortness of breath chronically progressive along     with pleuritic chest pain seen with pneumonia.  Supplemental oxygen to     keep his saturations greater than 94%.  3. Hypertension.  Unknown home medication dose.  We will restart his ACE     inhibitor, hold his beta-blocker until his urine drug screen comes back     negative.  4. Cocaine.  Follow blood pressures.  The patient states some noncompliance     with medications.  5. Hypercholesterolemia.  Check a fasting lipid panel.  Continue statin for     plaque stabilization and lipid lowering.  6. Depression, not on medications at this point, not suicidal per history     but he is under a lot of stress.  7. Tobacco abuse.  Recommend cessation. 8. Cocaine abuse.  Check urine drug screen now.  Lorne Skeens, D.O.    KL/MEDQ  D:  06/02/2003  T:  06/02/2003  Job:  [id number]   cc:   Eulas Post [facility]

## 2010-06-10 NOTE — Discharge Summary (Signed)
[name], [name] NO.:  [id number]   MEDICAL RECORD NO.:  [id number]                   PATIENT TYPE:  INP   LOCATION:  4730                                 FACILITY:  [facility]   PHYSICIAN:  Asencion Partridge, M.D.                  DATE OF BIRTH:  Jun 02, 1960   DATE OF ADMISSION:  06/01/2003  DATE OF DISCHARGE:  06/02/2003                                 DISCHARGE SUMMARY   DISCHARGE DIAGNOSES:  1. __________ .  2. Cocaine abuse.  3. Tobacco abuse.  4. History of __________ , status post catheterization and stents, October     of 2003.  5. History of depression.  6. Hypertension.   DISCHARGE MEDICATIONS:  1. __________ .  2. The patient is to resume all home medications which are listed as:     a. Lipitor 10 mg daily.     b. Aspirin 325 mg daily.     c. __________  600 mg b.i.d.     d. Altace __________ mg daily.     e. __________ mg daily.     f. __________ mg daily (patient was warned that he should not __________        ).   FOLLOWUP:  1. The patient has an appointment, Thursday, Jun 11, 2003, at 3:20 at [facility].  2. The patient had a chest x-ray during hospitalization that should be     followed up within 6 weeks.   BRIEF ADMISSION HISTORY:  This is a 50 year old [place] male who presented  with progressively increasing dyspnea over the past week, midsternal chest  pain which radiated to his left shoulder and jaw which felt tight, lasted  approximately 1 hour.  The patient felt like it was similar to his prior MI.  It was relieved with aspirin and nitroglycerin __________  by EMS.  The  patient reported cold sweats, nauseated with his chest pain, complained of a  dry cough x2-3 weeks.  The patient also reported that his chest pain was  pleuritic in nature and worse with cough and deep inspiration.  He denied  any recent exertional chest pain but did report dyspnea on exertion.  The  patient does smoke a half pack per  day with 30-plus-pack-year history.   PHYSICAL EXAMINATION:  ADMISSION VITALS:  Temperature 97.8, pulse of 115,  respirations of 15, blood pressure 111/44, saturating 93% on room air.  GENERAL:  Physical exam essentially unremarkable.   ADMISSION LABORATORIES:  PT __________ , INR 1.0.  Creatinine 1.1.  I-STAT  __________  235.3, bicarb 24.7, hemoglobin 14.3, hematocrit 42.0, sodium was  139, potassium was 3.6, chloride __________ , glucose 108, __________ .  Point-of-care cardiac enzymes:  __________  myoglobin of 10, troponin less  than 0.05, __________   EKG showed sinus tachycardia with heart rate of 104.  There was  T wave  inversion in leads I and II, V4, 5, and 6.   Chest x-ray showed patchy airspace disease __________  perihilar areas, most  likely  represent __________  .  These changes could be related to  __________ .  Follow-up films suggested.   HOSPITAL COURSE:  PROBLEM #1 - CHEST PAIN __________ :  The patient ruled  out for MI with serial cardiac enzymes and EKGs.  Lipid profile was also  obtained.  The patient's cardiac enzymes were negative x3 __________  CK-MB  of 2.4, troponin of 0.03 and CK of 159.  Third set showed troponin of 0.02,  CK-MB of 1.6 and CK of 96.  The patient's liver profile showed a total  cholesterol of 169, triglyceride of 116, HDL of 34 and LDL of 112.  The  patient's urine drug screen was positive for cocaine.  The patient's repeat  EKG in the morning had nonspecific ST-T wave changes, however, the T-wave  inversions had resolved.  Chest pain was thought to be secondary to cardiac  strain, likely secondary to cocaine abuse.  As noted, the patient was  positive for cocaine.  Cardiac enzymes were within normal limits x3.  His  last echo was July 31, 2002 and it was felt that the patient was stable from  cardiac standpoint and he did not have a cardiac event and was stable for  discharge __________  followup.   PROBLEM #2 - IRREGULAR CHEST X-RAY  WITH COUGH AND DYSPNEA:  It was  considered that the patient had a pneumonia __________ chest x-ray showed  bibasilar infiltrates.  The patient received a dose of Rocephin and  azithromycin IV after receiving the chest x-ray and __________  as above  __________  patient's history, it was felt like this was more of an atypical  pneumonia picture versus CHF.  __________ was ordered, which were started at  2.5 __________ more towards an atypical pneumonia, considering that the  patient's felt much better.  No dyspnea on exertion the day after admission  and subsequently, the patient was stable for discharge __________  pneumonia, considering he had already __________  echo at this time, since  he had had one in July of last year.  The patient had followup set up for a  followup chest x-ray at [facility].  The patient was told  that he did need to have followup of these things.  The patient was afebrile  during the entire hospitalization and did not require __________ .   PROBLEM #3 - __________ .  The patient did __________ during hospitalization  __________ .   PROBLEM #4 - HYPERTENSION:  The patient was __________ positive for cocaine.  Blood pressures were in the 120s over 80s during hospitalization.   PROBLEM #5 - __________ during hospitalization and he reports he was under a  lot of stress, however, ultimately, he had not been taking any medications  for this.  He was not restarted on any medications during hospitalization.   OTHER LABORATORIES AT DISCHARGE:  __________  antibody was nonreactive.  Sodium 141, potassium 3.6, chloride 109, CO2 __________ , glucose of 102,  BUN of 18, creatinine of 1.0, calcium of 9.1.  AST of 28, ALT of 18,  alkaline phosphatase of 45, total protein of 7.3, albumin 3.8, total  bilirubin 1.2.      Anastasio Auerbach, MD  Asencion Partridge, M.D.   AD/MEDQ  D:  06/02/2003  T:  06/03/2003  Job:  [id number]   cc:    Eulas Post [facility]

## 2010-06-10 NOTE — Consult Note (Signed)
NAMECAMILO, [name] NO.:  [id number]   MEDICAL RECORD NO.:  [id number]                   PATIENT TYPE:  IPS   LOCATION:  0505                                 FACILITY:  [facility]   PHYSICIAN:  Karlene Einstein, M.D.             DATE OF BIRTH:  [birth year]   DATE OF CONSULTATION:  08/28/2002  DATE OF DISCHARGE:                                   CONSULTATION   REFERRING PHYSICIANS:  1. Jeanice Lim, M.D.  2. Geoffery Lyons, M.D.   REASON FOR CONSULTATION:  Uncontrolled hypertension.   HISTORY OF PRESENT ILLNESS:  A 50 year old [name] male admitted to the  [facility] secondary to depression and suicidal ideation.  I am being consulted for uncontrolled hypertension. The patient has a  history of hypertension and was on hydrochlorothiazide and Altace until 3  weeks ago. He states his blood pressures are usually normal when on  medication. However, 3 weeks ago he stopped taking his medication thinking  he could die from not taking the medication. He denies chest pain, shortness  of breath, headache, blurred vision, or any other problems.   REVIEW OF SYSTEMS:  GENERAL: No fever, chills.  EYES:  No blurry vision. No  diplopia. ENT:  No pain, tinnitus. RESPIRATORY: No cough, hemoptysis,  shortness of breath, or chest pain. CARDIORESPIRATORY: No palpitations,  edema, or PND.  GI: Currently having abdominal cramps and nausea but no  vomiting. He does have diarrhea since last night. Denies melena,  hematochezia. GU: No dysuria or hematuria. MUSCULOSKELETAL: No pain and no  weakness. SKIN: No rash, pruritus or purpura. NEUROLOGIC: No weakness,  numbness or tingling.   PAST MEDICAL HISTORY:  Significant for coronary artery disease status post  CABG 6 weeks ago and hypertension. Hyperlipidemia.   PAST SURGICAL HISTORY:  CABG 6 weeks ago. Right knee surgery.   ALLERGIES:  No known drug allergies.   FAMILY HISTORY:  Significant for coronary  artery disease and secondary to  that he may __________  hypertension. Denies diabetes or cancers.   SOCIAL HISTORY:  He is married but separated from his wife and children.  Used to smoke 1 pack per day for 35 years and quit 6 weeks ago. Drinks  alcohol only occasionally. Denies drug abuse. 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   CURRENT MEDICATIONS:  1. Altace 5 mg q.d.  2. Aspirin 325 mg q.d.  3. Lopid 600 mg b.i.d.  4. Hydrochlorothiazide 12.5 mg q.d.  5. Plavix 75 mg q.d.  6. Ambien 10 mg q.h.s.  7. Protonix 40 mg q.d.  8. Lexapro 10 mg q.d.  9. Potassium chloride 40 mEq q.d.   PHYSICAL EXAMINATION:  VITAL SIGNS: Temperature 98.5, pulse 98, respiratory  rate 20, blood pressure 152/107.  GENERAL: No acute distress. Cooperative.  HEENT: Pupils are equal, round, and reactive to light. Extraocular muscles  intact. ENT:  Oropharynx moist.  NECK: Anterior neck supple. No JVD or thyromegaly.  NODES: No cervical lymphadenopathy.  LUNGS: Clear to auscultation bilaterally. Normal effort.  No rales, rhonchi  or wheezing.  HEART: Regular rate and rhythm. Normal S1 and S2. No murmurs, gallops, or  rubs.  ABDOMEN: Soft, nontender. Positive bowel sounds. No organomegaly.  EXTREMITIES: Pulses +2. No edema.  SKIN: No rash, purpura, or petechiae.  MUSCULOSKELETAL: Full range of motion. No tenderness or deformity.  NEUROLOGIC: Alert and oriented x3. No focal deficits.   LABORATORY DATA:  Sodium 141, potassium 3.5, glucose 88, creatinine 0.9,  calcium 9.1. Urine drug screen negative. Urinalysis negative.  TSH 0.695.  Hemoglobin 13.9, platelets 228.   ASSESSMENT:  1. Uncontrolled hypertension. Most likely secondary to noncompliance.     Continue hydrochlorothiazide at 12.5 mg q.d. Increase Altace to 10 mg     q.d. and follow blood pressures. He is asymptomatic right now.  2. Coronary artery disease. Continue current medication.  3. Hyperlipidemia.  Continue Lopid.  4. Diarrhea. Likely a viral cause. Follow closely.   Dr. Jamie Brookes to follow with you. I appreciate the consultation.                                               Karlene Einstein, M.D.    GD/MEDQ  D:  08/28/2002  T:  08/28/2002  Job:  [id number]

## 2010-06-10 NOTE — Discharge Summary (Signed)
[name], [name] NO.:  [id number]   MEDICAL RECORD NO.:  [id number]                   PATIENT TYPE:  INP   LOCATION:  2001                                 FACILITY:  [facility]   PHYSICIAN:  Evelene Croon, M.D.                  DATE OF BIRTH:  10/25/1960   DATE OF ADMISSION:  07/07/2002  DATE OF DISCHARGE:  07/12/2002                                 DISCHARGE SUMMARY   ADMISSION DIAGNOSIS:  Prolonged chest pain consistent with unstable angina.   PAST MEDICAL HISTORY:  1. Coronary artery disease, status post MI in October 2003 followed  by     stenting of the circumflex marginal vessel.  2. Hypertension.  3. Hyperlipidemia.  4. Tobacco habituation.   PAST SURGICAL HISTORY:  Right knee surgery.   ALLERGIES:  No known drug allergies.   DISCHARGE DIAGNOSIS:  Severe 3-vessel coronary artery disease with unstable  angina, status post  emergent coronary artery bypass grafting.   BRIEF HISTORY:  [name] is a 50 year old Caucasian man. He has been doing  well since treatment of his MI with stenting of his circumflex artery last  October. On the morning of July 07, 2002, he was awakened with chest pain  which radiated to his shoulder and arm. This was accompanied by shortness of  breath and diaphoresis. He took 3 nitroglycerin tablets and his pain went  from a 10 to a 7 on a 0 to 10 pain scale. He presented to the [facility] emergency department and was evaluated there by Dr. Charlies Constable.   HOSPITAL COURSE:  On July 07, 2002, [name] was evaluated by Dr. Charlies Constable in the [facility] emergency department. His admitting EKG  showed sinus bradycardia, otherwise  normal. Admitting blood pressure was  210/120, pulse 58. After examination of [name] Dr. Regino Perez impression  was that of prolonged chest pain consistent with unstable angina. His  recommendation was to proceed with cardiac catheterization.   Catheterization revealed   an occluded 2nd obtuse marginal artery which was  stented in October 2003. It was also __________  could not be real. His  catheter also revealed  50% stenosis of the right coronary artery, 80% of  the PDA, 70% ostial diagonal and 90% small OM, 70% proximal left circumflex.  Ejection fraction was estimated to be 50% to 55%. He continued to have chest  pain in the catheter laboratory  and an intraaortic balloon pump was  inserted.   Dr. Juanda Chance requested a cardiac surgery consultation and Dr. Laneta Simmers evaluated  [name] in the catheterization laboratory. Dr. Sharee Pimple recommendation was  to proceed with emergent coronary artery bypass grafting. The procedure  risks and benefits were discussed  with [name] and his mother, and they  agreed to proceed with surgery.   He was brought emergently to the operating room  and underwent  the following  surgical procedure, emergency coronary artery bypass grafting x4. Grafts  done at the time of the procedure, the saphenous vein was grafted to the 1st  diagonal artery, the saphenous vein was grafted to the 2nd obtuse marginal  (a thrombectomy was also performed to this artery). The saphenous vein was  grafted in a  sequential fashion to the posterior descending  posterolateral  artery. Vein was harvested from the left thigh using Endovein harvesting  technique as well as the left lower leg with an open surgical technique.   He tolerated the procedure well and was transferred in stable condition to  the SICU. He remained hemodynamically stable in the immediate postoperative  period and was extubated several hours after arrival in the intensive care  unit. His postoperative course has been uneventful. The intraaortic balloon  pump was removed on the morning of postoperative day #1.  He was transferred  to unit 2000 on postoperative day #2.   On the morning of July 11, 2002, postoperative day #4, [name] reports  feeling very well. His vital signs  were stable with blood pressure 116/74.  He was afebrile and his room air saturation was 90%. His heart has  maintained normal sinus rhythm since surgery. His lungs are clear. He is  tolerating his diet. His urine output has been excellent. His incisions are  all healing well. He does have minimal left ankle and foot edema. He is  ambulating frequently in the hallway with minimal assistance. His pain  control is adequate.  If [name] continues to progress in this manner it  is anticipated he will be ready for discharge home tomorrow, July 12, 2002.   [name] has also been by a smoking cessation counselor while in the  hospital and he relates to her that he is now a nonsmoker. He will be  followed  post discharge  with phone calls.   LABORATORY DATA:  July 09, 2002, CBC, Cobbins blood cells 11.8, hemoglobin  10.0, hematocrit 28.1, platelets 187. Chemistries:  Sodium 136, potassium  3.5, chloride 104, CO2 30, BUN 10, creatinine  0.8, glucose 110. BMP will be  repeated on the morning of July 12, 2002.   CONDITION ON DISCHARGE:  Improved.   DISCHARGE MEDICATIONS:  He has been instructed to resume the following  home  medications:  1. Enteric coated aspirin 25 mg p.o. every day.  2. Toprol XL 25 mg p.o. every day, this is a new dose for him.  3. HCTZ 25 mg 1/2 tablet daily.  4. Lipitor 10 mg p.o. every day.  5. Gemfibrozil 600 mg b.i.d.  6. Also suggested to use Colace 200 mg p.o. every day while using Tylox.  7. For pain management he may have Tylox 1 to  2 tablets q.4-6h. p.r.n. mild     to severe pain or Tylenol 325 mg 1 to  2 tablets q.4-6h. p.r.n. mild     pain.   DISCHARGE INSTRUCTIONS:  Activity, he has been asked to refrain from any  driving, heavy lifting, pushing or pulling. He has also been instructed to  continue his breathing exercises and daily walking. He also is encouraged to continue with his smoking cessation efforts. His diet should be a heart  healthy diet. Wound  care, he may shower at home. If his incisions are red,  hot, swollen, draining or he has a temperature greater than 101 degrees  Farenheit he is to call Dr. Sharee Pimple office.  FOLLOW UP:  He has an appointment to see Dr. Samule Ohm in the office on July 24, 2002, at 3 p.m. He will have a chest x-ray taken that day. He has an  appointment to see Dr. Laneta Simmers at the [facility] office on Tuesday, July 29, 2002,  at 12:45.     Toribio Harbour, N.P.                  Evelene Croon, M.D.    CTK/MEDQ  D:  07/11/2002  T:  07/14/2002  Job:  [id number]   cc:   Salvadore Farber, M.D.   [facility]    cc:   Salvadore Farber, M.D.   [facility]

## 2010-06-10 NOTE — H&P (Signed)
NAMEJAYTON, [name] NO.:  [id number]   MEDICAL RECORD NO.:  [id number]                   PATIENT TYPE:  EMS   LOCATION:  MAJO                                 FACILITY:  [facility]   PHYSICIAN:  [name], M.D.                  DATE OF BIRTH:  January 15, 1961   DATE OF ADMISSION:  07/07/2002  DATE OF DISCHARGE:                                HISTORY & PHYSICAL   CHIEF COMPLAINT:  Chest pain.   CLINICAL HISTORY:  [name] is 50 years old and has documented coronary  artery disease.  Last October, he had a DMI treated with stenting of the  circumflex marginal vessel by Dr. Samule Ohm using a Cypher stent.  Had good LV  function at that time.  He has done fairly well until this morning, when he  developed recurrent chest pain, which he describes as substernal and  slightly to the left of center chest pain, with some radiation down his left  arm.  He describes this as a severe ache and had some shortness of breath  and diaphoresis.  He took nitroglycerin with some partial relief but no  permanent relief and came to the emergency room.  The pain is currently  improved slightly and is a 4/10.   PAST MEDICAL HISTORY:  1. Significant for hypertension.  2. Hyperlipidemia.  3. He continues to smoke.   PAST MEDICAL HISTORY:  He is status post right knee surgery.   CURRENT MEDICATIONS:  1. Include Altace 10 mg daily.  2. Toprol XL 200 mg daily.  3. Lipitor 10 mg daily.  4. Norvasc 10 mg daily.  5. Aspirin 325 mg daily.  6. Nitroglycerin p.r.n.  7. Questionable fluid pill.  8. Tylenol Cold Medication for the past three weeks.   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  For details,  please see [name]'s complete note.   FAMILY HISTORY:  For details, please see [name]'s complete note.   REVIEW OF SYSTEMS:  For details, please see [name]'s complete note.   PHYSICAL EXAMINATION:  VITAL SIGNS:  Blood pressure is 210/120,  pulse 58 and  regular.  NECK:  There was no venous distention.  The carotid pulses were full without  bruits.  Thyroid is not enlarged.  CHEST:  The chest was clear without rales or rhonchi.  CARDIAC:  Rhythm is regular.  The first and second heart sounds are normal.  There were no murmurs or gallops.  ABDOMEN:  Was soft without hepatosplenomegaly.  The bowel sounds were  normal.  EXTREMITIES:  Showed good peripheral pulses, and there was no peripheral  edema.  MUSCULOSKELETAL SYSTEM:  Showed no deformities.  NEUROLOGICAL:  Showed no focal neurological findings.  SKIN:  Warm and dry.   LABORATORY DATA:  An EKG showed sinus bradycardia, was otherwise normal.   IMPRESSION:  1. Prolonged chest pain consistent with unstable angina.  2. Coronary artery disease, status post prior stenting of the circumflex     marginal vessel for a diaphragmatic myocardial infarction, October 2003.  3. Hypertension.  4. Hyperlipidemia.   RECOMMENDATIONS:  With continued pain, I think the best course is to  evaluate [name] with pulmonary angiography.  We will plan on taking him  to the cath lab in the next hour or two.                                               [name], M.D.    BB/MEDQ  D:  07/07/2002  T:  07/07/2002  Job:  [id number]   cc:   Collins Scotland, M.D.   Aggie Hacker, M.D.  [address] [place]  [place] [address]  Fax: [phone]   Salvadore Farber, M.D.    cc:   Collins Scotland, M.D.   Aggie Hacker, M.D.  [address] [place]  [place] [address]  Fax: [phone]   Salvadore Farber, M.D.

## 2010-06-10 NOTE — Assessment & Plan Note (Signed)
[facility] OFFICE NOTE   NAME:Hanselman, JEANCARLO LEFFLER                         MRN:          [id number]  DATE:03/01/2006                            DOB:          [birth year]    REFERRING CARDIOLOGIST:  Dr. Randa Evens.   Mr. Blodgett is a 50 year old married male who comes in today on referral  from Dr. Samule Ohm for medical eval.   PAST MEDICAL HISTORY:  Was hospitalized in 2006 for a triple bypass.  Prior to that he had stents x3 in 2000, 2002, and 2004.  In December of  2007, he was admitted for congestive heart failure.  At that time, he  had lost his job and out of his medications and went into heart failure.   OUTPATIENT SURGERY:  None.   ILLNESSES:  None.   INJURIES:  None.   DRUG ALLERGIES:  None.   He smokes, but he is down to 2 cigarettes a day with the Chantix.  He is  on his second month.  Advised to put the cigarettes down completely.  He  still drinks an occasional drink of alcohol, but he stopped using drugs.  He has been clean for 2 years now.  Previous evals have been down at  [facility].  Last physical, he does not recall.   SOCIAL HISTORY:  He is originally from [place].  He works at [employer].  He is married for the second time.  He has a 73 year old son  by his first marriage, 2 stepchildren, who are 68 and 69, by the second  marriage.  He has a long history of drug abuse.  He has been in rehab  many times.  He has been up in [place], [place], he has been down to  [facility].  His drug was cocaine.  He is currently drug free for 2 years.   He is not getting any aftercare.  He says that when he goes to those  meetings, they talk about drugs, and it makes him want them.  But he has  not used for 2 years.  I explained to him the importance of aftercare.   FAMILY HISTORY:  Dad died at 58, had coronary disease, rheumatoid  arthritis, hypertension, hyperlipidemia and a smoker.   Mother is 37, has  hypertension and multiple CVAs, and has a history of bipolar depression.  No brothers, 2 sisters.  One sister has a history of hypertension, RA,  the other had a childhood seizure.   PROBLEM LIST:  Hypertension.  Patient has a long standing history of  hypertension.  He is on Coreg 6.25 b.i.d., Norvasc 5 daily, Lasix 40  daily, Zestril 10 daily.  BPs remain at 150 systolic, diastolic is in  the 80s.   OBJECTIVE:  Height 5 feet 8 inches, weight 194, BP 160/71 by nurse,  150/80 by me.  Pulse is 70 and regular.  He is afebrile.   IMPRESSION:  Hypertension, not at goal.   PLAN:  1. Increased angiotensin-converting enzyme inhibitor to 20 mg daily,  BP check b.i.d., followup 1 month.  2. Left ear pain.  Patient has had a left ear pain for 3 days.  No      history of trauma.  He has not been sick in any way.   OBJECTIVE:  Again, vital signs stable, he was afebrile.  Ear exam was normal.  He was tender to his left TMJ.   IMPRESSION:  Temporomandibular joint disorder.   PLAN:  Motrin 600 t.i.d., mouth guards, soft diet, return p.r.n.   Patient advised to come back for a complete physical examination since  he is due.  Blood pressure followup in 1 month.  When we do his physical  will address all his other health issues.     Jeffrey A. Tawanna Cooler, MD  Electronically Signed    JAT/MedQ  DD: 03/01/2006  DT: 03/01/2006  Job #: [id number]   cc:   Salvadore Farber, MD

## 2010-06-10 NOTE — Discharge Summary (Signed)
[place]  Patient:    [name], [name] Visit Number: [id number] MRN: [id number]          Service Type: MED Location: [phone] Attending Physician:  Daisey Must Dictated by:   Joellyn Rued, P.A.-C. Admit Date:  11/30/2000 Discharge Date: 12/03/2000   CC:         Daisey Must, M.D. [facility]   Referring Physician Discharge [place]  DATE OF BIRTH:  1960/03/27  SUMMARY OF HISTORY:  [name] is a 50 year old male, who presented on November 30, 2000, in the early evening with chest discomfort radiating into the left neck and arm.  He described this as a heaviness associated with shortness of breath, diaphoresis.  He was initially awakened on the day of admission with chest discomfort, and it eased with sublingual nitroglycerin, and he returned back to sleep.  However, the discomfort returned that afternoon and decreased with sublingual nitroglycerin.  After returning for a third time, he presented to the emergency room.  His history is notable for hypertension, tobacco use, remote cocaine use, current marijuana use, prior cardiac catheterization in October 2000, showed an EF of 55%, mild hypokinesis of the posterolateral wall and apex, 25% proximal LAD, 60% LAD at the origin, 50% ostial diagonal 2, 25% ostial and 25% mid circumflex, 90% OM-1, diffuse 75% OM-2, 75% proximal OM-3, 75% distal RCA.  LABORATORY DATA:  CKs and troponins were negative for myocardial infarction. Fasting lipids showed a total cholesterol of 179, triglycerides 196, HDL 36, LDL 104, VLDL 39.  Admission sodium was 143, potassium 4.1, BUN 10, creatinine .9, normal LFTs.  Subsequent chemistries were unremarkable.  Admission PT was 12.5, PTT 120.  Admission WBCs 10.1, H&H 14.7 and 41.8, normal indices, platelets 294.  Subsequently hematologies were unremarkable.  Admission chest x-ray showed interval development of a small amount of linear atelectasis or scar formation  in the left lower lung.  EKG showed sinus bradycardia.  Echocardiogram performed on the 11th showed an EF of 55-65%, no wall motion abnormalities, mild aortic valve calcification.  HOSPITAL COURSE:  [name] was admitted to 56.  He occasionally had some shoulder pain during his admission.  He ruled out for myocardial infarction and did not have any further chest discomfort.  Cardiac catheterization was performed on December 03, 2000, by Dr. Antoine Poche.  This showed a normal left pain, proximal 25% LAD, 60% proximal diagonal 1, small diagonal 2, 50% proximal diagonal 3, 25% ostial circumflex, small OM-1, 70% long proximal OM-2, 75% mid OM-3.  The RCA was dominant with a mild small aneurysm on ______, proximal 70% lesion, 60% mid lesions.  EF was 55%.  It was felt that he had nonobstructive coronary artery disease, that he should continue medical treatment with risk factor modification.  Echocardiogram was performed to evaluate question of mild to moderate MR on cardiac catheterization. Postsheath removal on bed rest, he was ambulating without difficulty. Catheterization site was intact, thus he was discharged home.  DISCHARGE DIAGNOSES: 1. Unstable angina. 2. Continued tobacco use. 3. Hyperlipidemia. 4. Nonobstructive coronary artery disease.  DISPOSITION:  He is discharged home.  DISCHARGE MEDICATIONS: 1. Coated aspirin 325 mg q.d. 2. Toprol XL 200 q.d. 3. Altace 10 q.d. 4. Nexium 400 q.d. 5. Lipitor 10 q.h.s. 6. Norvasc 10 q.d. 7. Sublingual nitroglycerin p.r.n.  DISCHARGE INSTRUCTIONS: 1. He was advised no lifting, driving, sexual activity, or heavy exertion for    two days. 2. Maintain a low salt/fat/cholesterol diet. 3. If  the had has any problems with his catheterization site, he was asked to    call. 4. Avoid smoking or tobacco products. 5. He will see Dr. Gerri Spore in the office on January 08, 2001, at 10:15 a.m. Dictated by:   Joellyn Rued, P.A.-C. Attending  Physician:  Daisey Must DD:  03/12/01 TD:  03/12/01 Job: [id number]

## 2010-06-10 NOTE — Discharge Summary (Signed)
[name], [name] NO.:  [id number]   MEDICAL RECORD NO.:  [id number]                   PATIENT TYPE:  IPS   LOCATION:  0508                                 FACILITY:  [facility]   PHYSICIAN:  Jeanice Lim, M.D.              DATE OF BIRTH:  1960/11/09   DATE OF ADMISSION:  07/20/2003  DATE OF DISCHARGE:  07/26/2003                                 DISCHARGE SUMMARY   IDENTIFYING DATA:  This is a 50 year old married Caucasian male, voluntarily  admitted, admitting a history of crack cocaine abuse, taking 10 Valium on  Friday, reporting that he woke, called wife who brought him here.  Intention  was to kill himself.  Last use of cocaine Saturday morning.  Reported  significant depressive symptoms with impaired sleep, frustration tolerance,  memory, concentration.  This is his second hospitalization at [facility], here 1 year ago, with history of crack cocaine and depression  and possible prior overdose on Valium.  Longest period of abstinence was 8  months.  Family history is positive for bipolar disorder.  Mother received  ECT.  The patient had coronary artery disease, hypertension, a bypass in  June 2004.   ADMISSION MEDICATIONS:  Lexapro 20 mg for one year, Toprol XL 25 mg, Lopid  600 mg daily, Nexium 40 mg daily, aspirin 325 mg daily, Lipitor 20 mg and  Altace 10 mg.  The patient had been off medications for 10 days.   ALLERGIES:  No known drug allergies.   PHYSICAL EXAMINATION:  Neurological exam within normal limits.   ROUTINE ADMISSION LABS:  Within normal limits.   MENTAL STATUS EXAM:  Alert, oriented, cooperative, good eye contact.  Speech  clear, mood depressed, affect teary eyed, the patient goal directed, thought  content positive for suicidal ideation, some regret regarding overdose and  highly motivated to become abstinent from substances of abuse, tired of this  ruining his life.  Cognitively intact.  Judgment and  insight poor.   ADMISSION DIAGNOSES:   AXIS I:  1. Depressive disorder not otherwise specified.  2. Cocaine abuse.   AXIS II:  Deferred.   AXIS III:  Coronary artery disease, elevated cholesterol.   AXIS IV:  Moderate problems with primary support group, occupation problems,  economic problems,  medical problems.   AXIS V:  30/55.   HOSPITAL COURSE:  The patient was admitted and ordered routine p.r.n.  medications, underwent further monitoring, and was encouraged to participate  in individual, group and milieu therapy, was placed on detox protocol,  developed relapse prevention, participated in substance abuse therapy.  The  patient showed a dramatic response to inpatient stabilization and showed  motivation to go to [place] one day with family prior to going to a  residential substance abuse treatment.  Appeared highly motivated and was  discharged in improved condition with no acute withdrawal symptoms, no  dangerous ideation, no psychotic symptoms and less severely depressed.  No  suicidal thoughts and motivation to complete program and relapse prevention  plan.  The patient was given medication education and discharged on:  1. Lexapro 20 mg q.a.m.  2. Toprol XL 25 mg q.a.m.  3. Ambien 10 mg q.h.s. p.r.n. insomnia.  4. Seroquel 25 mg q.6 p.r.n. anxiety.  5. Lipitor 10 mg q.6 p.m.  6. Altace 10 mg q.a.m.  7. Aspirin 325 mg q.a.m.  8. Lopid 600 mg q.a.m.  9. Protonix 40 mg 2 q.a.m.   DISPOSITION:  The patient was to stay with family, supervised, without  vehicle or any time alone on the day prior to going to [facility] treatment  center in [place].   DISCHARGE DIAGNOSES:   AXIS I:  1. Depressive disorder not otherwise specified.  2. Cocaine abuse.   AXIS II:  Deferred.   AXIS III:  Coronary artery disease, elevated cholesterol.   AXIS IV:  Moderate problems with primary support group, occupation problems,  economic problems,  medical problems.   AXIS V:  Global  assessment of function on discharge was 55-60.                                               Jeanice Lim, M.D.    [name]  D:  08/15/2003  T:  08/16/2003  Job:  [id number]

## 2010-10-18 LAB — COMPREHENSIVE METABOLIC PANEL
ALT: 21
AST: 20
CO2: 24
Chloride: 105
GFR calc Af Amer: >60
GFR calc non Af Amer: >60
Potassium: 4.1
Sodium: 137
Total Bilirubin: 0.7

## 2010-10-18 LAB — BASIC METABOLIC PANEL
BUN: 10
BUN: 12
Calcium: 9.6
Chloride: 104
Creatinine, Ser: 0.96
GFR calc non Af Amer: >60
Potassium: 3.9
Sodium: 138

## 2010-10-18 LAB — CBC
HCT: 37.8 — ABNORMAL LOW
MCV: 90
Platelets: 254
Platelets: 272
RBC: 4.35
RDW: 13.3
WBC: 10.2
WBC: 11.9 — ABNORMAL HIGH
WBC: 9.3

## 2010-10-18 LAB — POCT CARDIAC MARKERS
CKMB, poc: <1 — ABNORMAL LOW
Myoglobin, poc: 91.8

## 2010-10-18 LAB — TSH: TSH: 1.607

## 2010-10-18 LAB — CARDIAC PANEL(CRET KIN+CKTOT+MB+TROPI)
CK, MB: 99.5 — ABNORMAL HIGH
Relative Index: 13.3 — ABNORMAL HIGH
Relative Index: 15 — ABNORMAL HIGH
Total CK: 484 — ABNORMAL HIGH
Troponin I: 12.23
Troponin I: 5.68

## 2010-10-18 LAB — LIPID PANEL
Cholesterol: 199
HDL: 28 — ABNORMAL LOW
LDL Cholesterol: 124 — ABNORMAL HIGH
Triglycerides: 236 — ABNORMAL HIGH

## 2010-10-18 LAB — CK TOTAL AND CKMB (NOT AT ARMC)
CK, MB: 15.8 — ABNORMAL HIGH
Relative Index: 6.4 — ABNORMAL HIGH
Total CK: 247 — ABNORMAL HIGH

## 2010-10-18 LAB — POCT I-STAT, CHEM 8
BUN: 18
Creatinine, Ser: 1.4
Hemoglobin: 13.6
Potassium: 4.2
Sodium: 139

## 2010-10-18 LAB — B-NATRIURETIC PEPTIDE (CONVERTED LAB): Pro B Natriuretic peptide (BNP): 116 — ABNORMAL HIGH

## 2010-10-18 LAB — MAGNESIUM: Magnesium: 2

## 2014-10-12 DIAGNOSIS — R079 Chest pain, unspecified: Secondary | ICD-10-CM | POA: Insufficient documentation

## 2014-10-24 DIAGNOSIS — Z95810 Presence of automatic (implantable) cardiac defibrillator: Secondary | ICD-10-CM

## 2014-10-24 HISTORY — PX: INSERT / REPLACE / REMOVE PACEMAKER: SUR710

## 2014-10-24 HISTORY — DX: Presence of automatic (implantable) cardiac defibrillator: Z95.810

## 2014-11-23 DIAGNOSIS — I252 Old myocardial infarction: Secondary | ICD-10-CM | POA: Insufficient documentation

## 2014-11-23 DIAGNOSIS — I429 Cardiomyopathy, unspecified: Secondary | ICD-10-CM | POA: Insufficient documentation

## 2014-11-23 DIAGNOSIS — I255 Ischemic cardiomyopathy: Secondary | ICD-10-CM | POA: Insufficient documentation

## 2014-11-23 DIAGNOSIS — Z95810 Presence of automatic (implantable) cardiac defibrillator: Secondary | ICD-10-CM | POA: Insufficient documentation

## 2015-01-20 DIAGNOSIS — K760 Fatty (change of) liver, not elsewhere classified: Secondary | ICD-10-CM | POA: Insufficient documentation

## 2015-01-20 DIAGNOSIS — J181 Lobar pneumonia, unspecified organism: Secondary | ICD-10-CM

## 2015-01-20 DIAGNOSIS — J189 Pneumonia, unspecified organism: Secondary | ICD-10-CM | POA: Insufficient documentation

## 2015-01-20 DIAGNOSIS — K219 Gastro-esophageal reflux disease without esophagitis: Secondary | ICD-10-CM | POA: Insufficient documentation

## 2015-02-09 DIAGNOSIS — G44209 Tension-type headache, unspecified, not intractable: Secondary | ICD-10-CM | POA: Insufficient documentation

## 2015-02-09 DIAGNOSIS — Z951 Presence of aortocoronary bypass graft: Secondary | ICD-10-CM | POA: Insufficient documentation

## 2015-02-09 DIAGNOSIS — G2581 Restless legs syndrome: Secondary | ICD-10-CM | POA: Insufficient documentation

## 2015-02-09 DIAGNOSIS — R011 Cardiac murmur, unspecified: Secondary | ICD-10-CM | POA: Insufficient documentation

## 2015-02-09 DIAGNOSIS — F339 Major depressive disorder, recurrent, unspecified: Secondary | ICD-10-CM | POA: Insufficient documentation

## 2015-02-09 DIAGNOSIS — H9190 Unspecified hearing loss, unspecified ear: Secondary | ICD-10-CM | POA: Insufficient documentation

## 2015-02-09 DIAGNOSIS — N529 Male erectile dysfunction, unspecified: Secondary | ICD-10-CM | POA: Insufficient documentation

## 2015-02-09 DIAGNOSIS — M5416 Radiculopathy, lumbar region: Secondary | ICD-10-CM | POA: Insufficient documentation

## 2015-02-09 DIAGNOSIS — G4700 Insomnia, unspecified: Secondary | ICD-10-CM | POA: Insufficient documentation

## 2015-02-09 DIAGNOSIS — I493 Ventricular premature depolarization: Secondary | ICD-10-CM | POA: Insufficient documentation

## 2015-02-09 DIAGNOSIS — M1A0720 Idiopathic chronic gout, left ankle and foot, without tophus (tophi): Secondary | ICD-10-CM | POA: Insufficient documentation

## 2015-03-31 DIAGNOSIS — Z1211 Encounter for screening for malignant neoplasm of colon: Secondary | ICD-10-CM | POA: Insufficient documentation

## 2015-04-01 DIAGNOSIS — K3189 Other diseases of stomach and duodenum: Secondary | ICD-10-CM | POA: Insufficient documentation

## 2015-04-01 DIAGNOSIS — K2270 Barrett's esophagus without dysplasia: Secondary | ICD-10-CM | POA: Insufficient documentation

## 2015-04-01 DIAGNOSIS — K31A0 Gastric intestinal metaplasia, unspecified: Secondary | ICD-10-CM | POA: Insufficient documentation

## 2015-07-15 DIAGNOSIS — N289 Disorder of kidney and ureter, unspecified: Secondary | ICD-10-CM | POA: Insufficient documentation

## 2015-07-15 DIAGNOSIS — K5732 Diverticulitis of large intestine without perforation or abscess without bleeding: Secondary | ICD-10-CM | POA: Insufficient documentation

## 2015-08-24 DIAGNOSIS — N2889 Other specified disorders of kidney and ureter: Secondary | ICD-10-CM

## 2015-08-24 HISTORY — DX: Other specified disorders of kidney and ureter: N28.89

## 2015-08-31 ENCOUNTER — Other Ambulatory Visit: Payer: Self-pay | Admitting: Urology

## 2015-08-31 DIAGNOSIS — N2889 Other specified disorders of kidney and ureter: Secondary | ICD-10-CM

## 2015-09-07 ENCOUNTER — Ambulatory Visit
Admission: RE | Admit: 2015-09-07 | Discharge: 2015-09-07 | Disposition: A | Discharge status: Home / Self Care | Payer: Medicare HMO | Source: Ambulatory Visit | Attending: Urology | Admitting: Urology

## 2015-09-07 DIAGNOSIS — N2889 Other specified disorders of kidney and ureter: Secondary | ICD-10-CM

## 2015-09-07 DIAGNOSIS — I2510 Atherosclerotic heart disease of native coronary artery without angina pectoris: Secondary | ICD-10-CM | POA: Insufficient documentation

## 2015-09-07 DIAGNOSIS — I509 Heart failure, unspecified: Secondary | ICD-10-CM | POA: Insufficient documentation

## 2015-09-07 DIAGNOSIS — I340 Nonrheumatic mitral (valve) insufficiency: Secondary | ICD-10-CM | POA: Insufficient documentation

## 2015-09-07 DIAGNOSIS — F40240 Claustrophobia: Secondary | ICD-10-CM | POA: Insufficient documentation

## 2015-09-07 HISTORY — DX: Reserved for inherently not codable concepts without codable children: IMO0001

## 2015-09-07 HISTORY — DX: Heart failure, unspecified: I50.9

## 2015-09-07 HISTORY — DX: Cerebral infarction, unspecified: I63.9

## 2015-09-07 HISTORY — PX: IR GENERIC HISTORICAL: IMG1180011

## 2015-09-07 HISTORY — DX: Acute myocardial infarction, unspecified: I21.9

## 2015-09-07 HISTORY — DX: Cardiac arrhythmia, unspecified: I49.9

## 2015-09-07 HISTORY — DX: Gastro-esophageal reflux disease without esophagitis: K21.9

## 2015-09-07 HISTORY — DX: Atherosclerotic heart disease of native coronary artery without angina pectoris: I25.10

## 2015-09-07 HISTORY — DX: Cardiac murmur, unspecified: R01.1

## 2015-09-07 HISTORY — DX: Headache: R51

## 2015-09-07 HISTORY — DX: Essential (primary) hypertension: I10

## 2015-09-07 HISTORY — DX: Major depressive disorder, single episode, unspecified: F32.9

## 2015-09-07 HISTORY — DX: Unspecified osteoarthritis, unspecified site: M19.90

## 2015-09-07 HISTORY — DX: Headache, unspecified: R51.9

## 2015-09-07 HISTORY — DX: Presence of automatic (implantable) cardiac defibrillator: Z95.810

## 2015-09-07 HISTORY — DX: Other specified disorders of kidney and ureter: N28.89

## 2015-09-07 HISTORY — DX: Presence of cardiac pacemaker: Z95.0

## 2015-09-07 HISTORY — DX: Angina pectoris, unspecified: I20.9

## 2015-09-07 HISTORY — DX: Depression, unspecified: F32.A

## 2015-09-07 NOTE — Consult Note (Signed)
Chief Complaint: Small inferior right renal mass.  Referring Physician(s): Stoneking,Bradley J.  History of Present Illness: [name] is a 55 y.o. male with a significant cardiac history including CHF, coronary bypass, and AICD insertion. He was recently hospitalized with diverticulitis. CT imaging during the hospitalization demonstrated a small right inferior renal mass. Renal mass CT protocol performed. This confirmed a small exophytic lower pole mass measuring 1.7 cm greatest dimension with very minimal enhancement. Lesion meets criteria for a low-grade renal cell carcinoma/neoplasm. No evidence of adenopathy or metastatic disease. Lesion is inferior and posterior and therefore low risk for percutaneous cryoablation. He remains asymptomatic. No significant abdominal pain or flank pain. No urinary tract symptoms or gross hematuria. Stable weight.  Past Medical History:  Diagnosis Date  . AICD (automatic cardioverter/defibrillator) present 10/2014  . Anginal pain ([place])   . Arthritis    gout  . CHF (congestive heart failure) ([place])   . Coronary artery disease   . Depression   . Dysrhythmia   . GERD (gastroesophageal reflux disease)    Barretts esophagus  . Headache   . Heart murmur   . Hypertension   . Myocardial infarction [facility])    has had 11 (first one in 1998)  . Presence of permanent cardiac pacemaker   . Right renal mass 08/2015  . Shortness of breath dyspnea   . Stroke [facility]) 2012   after last heart surgery    Past Surgical History:  Procedure Laterality Date  . CARDIAC VALVE REPLACEMENT     not replacement, but repair  . CORONARY ANGIOPLASTY     14 stents (cardiac)  . CORONARY ARTERY BYPASS GRAFT  early 2000s, 2012  . INSERT / REPLACE / REMOVE PACEMAKER  10/2014   with defibrillator    Allergies: Review of patient's allergies indicates no known allergies.  Medications: Prior to Admission medications   Medication Sig Start Date End Date Taking?  Authorizing Provider  allopurinol (ZYLOPRIM) 300 MG tablet Take 300 mg by mouth.   Yes Historical Provider, MD  aspirin EC 81 MG tablet Take 81 mg by mouth.   Yes Historical Provider, MD  atorvastatin (LIPITOR) 80 MG tablet Take 80 mg by mouth. 10/22/14  Yes Historical Provider, MD  carvedilol (COREG) 6.25 MG tablet Take 6.25 mg by mouth.   Yes Historical Provider, MD  clopidogrel (PLAVIX) 75 MG tablet TAKE ONE TABLET BY MOUTH ONCE DAILY 07/28/15  Yes Historical Provider, MD  ezetimibe (ZETIA) 10 MG tablet Take 10 mg by mouth.   Yes Historical Provider, MD  FLUoxetine (PROZAC) 10 MG capsule Take 10 mg by mouth.   Yes Historical Provider, MD  furosemide (LASIX) 40 MG tablet TAKE ONE TABLET BY MOUTH ONCE DAILY 08/25/15  Yes Historical Provider, MD  isosorbide mononitrate (IMDUR) 60 MG 24 hr tablet Take 60 mg by mouth. 08/30/15  Yes Historical Provider, MD  lisinopril (PRINIVIL,ZESTRIL) 20 MG tablet Take 20 mg by mouth. 06/25/15  Yes Historical Provider, MD  Multiple Vitamin (MULTI-VITAMINS) TABS Take by mouth.   Yes Historical Provider, MD  pantoprazole (PROTONIX) 40 MG tablet Take 40 mg by mouth.   Yes Historical Provider, MD  traMADol (ULTRAM) 50 MG tablet Take 50 mg by mouth. 09/09/14  Yes Historical Provider, MD  nitroGLYCERIN (NITROSTAT) 0.4 MG SL tablet Place 0.4 mg under the tongue. 10/22/14   Historical Provider, MD     No family history on file.  Social History   Social History  .  Marital status: Married    Spouse name: N/A  . Number of children: N/A  . Years of education: N/A   Social History Main Topics  . Smoking status: Former Smoker    Packs/day: 2.00    Years: 45.00    Types: Cigarettes    Start date: 09/06/1968    Quit date: 09/06/2008  . Smokeless tobacco: Never Used  . Alcohol use No  . Drug use:     Types: Cocaine, Marijuana     Comment: cocaine was in past (stopped early 2000s)  . Sexual activity: Yes   Other Topics Concern  . None   Social History Narrative  . None      ECOG Status: 0 - Asymptomatic  Review of Systems: A 12 point ROS discussed and pertinent positives are indicated in the HPI above.  All other systems are negative.  Review of Systems  Vital Signs: BP (!) 180/81 (BP Location: Right Arm, Patient Position: Sitting, Cuff Size: Normal)   Pulse (!) 57   Temp 97.6 F (36.4 C)   Resp 14   Ht 5\' 7"  (1.702 m)   Wt 220 lb (99.8 kg)   SpO2 95%   BMI 34.46 kg/m   Physical Exam  Constitutional: He is oriented to person, place, and time. He appears well-developed and well-nourished. No distress.  Moderately obese  Eyes: Conjunctivae are normal. No scleral icterus.  Cardiovascular: Normal rate, regular rhythm and normal heart sounds.  Exam reveals no friction rub.   No murmur heard. Pulmonary/Chest: Effort normal and breath sounds normal. No respiratory distress.  Abdominal: Soft. Bowel sounds are normal. He exhibits distension. He exhibits no mass. There is no tenderness. There is no guarding.  Obese abdomen.  Lymphadenopathy:    He has no cervical adenopathy.  Neurological: He is alert and oriented to person, place, and time.  Skin: Skin is warm and dry. No rash noted. He is not diaphoretic.  Psychiatric: He has a normal mood and affect. His behavior is normal.    Mallampati Score:    2  Imaging: CT with contrast performed 07/16/2015 at the urology office. This confirms a 1.7 x 1.5 cm slightly exophytic right inferior renal mass with mild enhancement. no evidence of adenopathy or metastatic process.  Labs: Most recent available labs 07/17/2015: Creatinine 1.08 Urinalysis negative CBC unremarkable  Assessment and Plan: 1.7 cm right kidney lower pole mildly enhancing mass compatible with a low-grade neoplasm by CT. Treatment options were reviewed including surveillance versus image guided cryoablation. The procedure, risks, benefits and alternatives were reviewed. After discussion all questions were addressed. He is clear  understanding of the procedure. He would favor proceeding with treatment rather than surveillance at this time. Of note, the patient has a pacemaker and cannot have an MRI scan.  Plan: Schedule for CT-guided cryoablation of the 1.7 cm right inferior renal neoplasm in the next few weeks.  Thank you for this interesting consult.  I greatly enjoyed meeting ELAY TRANI and look forward to participating in their care.  A copy of this report was sent to the requesting provider on this date.  Electronically Signed: Greggory Keen 09/07/2015, 2:52 PM   I spent a total of  40 Minutes   in face to face in clinical consultation, greater than 50% of which was counseling/coordinating care for with a 1.7 cm low-grade right renal neoplasm.

## 2015-10-28 DIAGNOSIS — J9601 Acute respiratory failure with hypoxia: Secondary | ICD-10-CM | POA: Insufficient documentation

## 2015-10-28 DIAGNOSIS — C649 Malignant neoplasm of unspecified kidney, except renal pelvis: Secondary | ICD-10-CM | POA: Insufficient documentation

## 2015-10-29 DIAGNOSIS — I214 Non-ST elevation (NSTEMI) myocardial infarction: Secondary | ICD-10-CM | POA: Insufficient documentation

## 2015-10-31 DIAGNOSIS — E876 Hypokalemia: Secondary | ICD-10-CM | POA: Insufficient documentation

## 2015-11-01 ENCOUNTER — Other Ambulatory Visit: Payer: Self-pay | Admitting: Physician Assistant

## 2015-11-01 DIAGNOSIS — C641 Malignant neoplasm of right kidney, except renal pelvis: Secondary | ICD-10-CM

## 2015-11-18 ENCOUNTER — Ambulatory Visit
Admission: RE | Admit: 2015-11-18 | Discharge: 2015-11-18 | Disposition: A | Discharge status: Home / Self Care | Payer: Medicare HMO | Source: Ambulatory Visit | Attending: Physician Assistant | Admitting: Physician Assistant

## 2015-11-18 DIAGNOSIS — C641 Malignant neoplasm of right kidney, except renal pelvis: Secondary | ICD-10-CM

## 2015-11-18 HISTORY — PX: IR GENERIC HISTORICAL: IMG1180011

## 2015-11-18 NOTE — Progress Notes (Signed)
Patient ID: [name], male   DOB: 1960/01/28, 55 y.o.   MRN: [id number]       Chief Complaint: 3 week status post small right renal cell carcinoma cryoablation.  Referring Physician(s): Stoneking, Leory Plowman  History of Present Illness: [name] is a 55 y.o. male with chronic CHF, coronary bypass, an AICD insertion. An incidental right 1.7 cm renal mass was demonstrated on a abdomen CT for abdominal pain. This was compatible with a low-grade renal cell carcinoma in the right kidney. He underwent successful right renal cryoablation at [facility] approximately 3 weeks ago. This was performed with general anesthesia and CT guidance.  Following the procedure, initially he was awakened and sedated. However in the PACU he experienced acute flash pulmonary edema requiring reintubation, central line placement and cardiology consultation. He was admitted to the ICU and aggressively diurese. After approximate 24-48 hours, he was extubated and his chest x-ray demonstrated resolution of the CHF. Once he was stabilized, left heart cath was also performed which demonstrated stable coronary disease without change compared to the prior cath.  Past Medical History:  Diagnosis Date  . AICD (automatic cardioverter/defibrillator) present 10/2014  . Anginal pain ([place])   . Arthritis    gout  . CHF (congestive heart failure) ([place])   . Coronary artery disease   . Depression   . Dysrhythmia   . GERD (gastroesophageal reflux disease)    Barretts esophagus  . Headache   . Heart murmur   . Hypertension   . Myocardial infarction    has had 11 (first one in 1998)  . Presence of permanent cardiac pacemaker   . Right renal mass 08/2015  . Shortness of breath dyspnea   . Stroke [facility]) 2012   after last heart surgery    Past Surgical History:  Procedure Laterality Date  . CARDIAC VALVE REPLACEMENT     not replacement, but repair  . CORONARY ANGIOPLASTY     14 stents (cardiac)  .  CORONARY ARTERY BYPASS GRAFT  early 2000s, 2012  . INSERT / REPLACE / REMOVE PACEMAKER  10/2014   with defibrillator    Allergies: Review of patient's allergies indicates no known allergies.  Medications: Prior to Admission medications   Medication Sig Start Date End Date Taking? Authorizing Provider  allopurinol (ZYLOPRIM) 300 MG tablet Take 300 mg by mouth.   Yes Historical Provider, MD  aspirin EC 81 MG tablet Take 81 mg by mouth.   Yes Historical Provider, MD  atorvastatin (LIPITOR) 80 MG tablet Take 80 mg by mouth. 10/22/14  Yes Historical Provider, MD  carvedilol (COREG) 12.5 MG tablet Take 12.5 mg by mouth 2 (two) times daily with a meal.   Yes Historical Provider, MD  clopidogrel (PLAVIX) 75 MG tablet TAKE ONE TABLET BY MOUTH ONCE DAILY 07/28/15  Yes Historical Provider, MD  ezetimibe (ZETIA) 10 MG tablet Take 10 mg by mouth.   Yes Historical Provider, MD  FLUoxetine (PROZAC) 10 MG capsule Take 10 mg by mouth.   Yes Historical Provider, MD  furosemide (LASIX) 40 MG tablet TAKE ONE TABLET BY MOUTH ONCE DAILY 08/25/15  Yes Historical Provider, MD  isosorbide mononitrate (IMDUR) 60 MG 24 hr tablet Take 60 mg by mouth. 08/30/15  Yes Historical Provider, MD  lisinopril (PRINIVIL,ZESTRIL) 20 MG tablet Take 20 mg by mouth. 06/25/15  Yes Historical Provider, MD  nitroGLYCERIN (NITROSTAT) 0.4 MG SL tablet Place 0.4 mg under the tongue. 10/22/14  Yes Historical Provider, MD  pantoprazole (  PROTONIX) 40 MG tablet Take 40 mg by mouth.   Yes Historical Provider, MD  carvedilol (COREG) 6.25 MG tablet Take 12.5 mg by mouth.     Historical Provider, MD  Multiple Vitamin (MULTI-VITAMINS) TABS Take by mouth.    Historical Provider, MD  traMADol (ULTRAM) 50 MG tablet Take 50 mg by mouth. 09/09/14   Historical Provider, MD     No family history on file.  Social History   Social History  . Marital status: Married    Spouse name: N/A  . Number of children: N/A  . Years of education: N/A   Social History  Main Topics  . Smoking status: Former Smoker    Packs/day: 2.00    Years: 45.00    Types: Cigarettes    Start date: 09/06/1968    Quit date: 09/06/2008  . Smokeless tobacco: Never Used  . Alcohol use No  . Drug use:     Types: Cocaine, Marijuana     Comment: cocaine was in past (stopped early 2000s)  . Sexual activity: Yes   Other Topics Concern  . Not on file   Social History Narrative  . No narrative on file      Review of Systems: A 12 point ROS discussed and pertinent positives are indicated in the HPI above.  All other systems are negative.  Review of Systems  Vital Signs: BP (!) 102/46 (BP Location: Left Arm, Patient Position: Sitting, Cuff Size: Large)   Pulse (!) 55   Temp 97.5 F (36.4 C) (Oral)   Resp 15   Ht 5' 7.5" (1.715 m)   Wt 220 lb (99.8 kg)   SpO2 97%   BMI 33.95 kg/m   Physical Exam  Constitutional:  Moderately obese male in no distress.  Neck: No JVD present.  Cardiovascular: Normal rate, regular rhythm and normal heart sounds.  Exam reveals no friction rub.   No murmur heard. Pulmonary/Chest: Effort normal and breath sounds normal. No respiratory distress. He has no wheezes. He has no rales.  Abdominal: Soft. Bowel sounds are normal. He exhibits no distension. There is no tenderness.  Protuberant obese abdomen. No CVA tenderness. Right flank ablation puncture site is well-healed.  Lymphadenopathy:    He has no cervical adenopathy.  Skin: Skin is warm and dry. No rash noted. He is not diaphoretic. No erythema.  Psychiatric: He has a normal mood and affect. His behavior is normal. Thought content normal.     Imaging: No results found.  Labs:  CBC: No results for input(s): WBC, HGB, HCT, PLT in the last 8760 hours.  COAGS: No results for input(s): INR, APTT in the last 8760 hours.  BMP: No results for input(s): NA, K, CL, CO2, GLUCOSE, BUN, CALCIUM, CREATININE, GFRNONAA, GFRAA in the last 8760 hours.  Invalid input(s): CMP  LIVER  FUNCTION TESTS: No results for input(s): BILITOT, AST, ALT, ALKPHOS, PROT, ALBUMIN in the last 8760 hours.  TUMOR MARKERS: No results for input(s): AFPTM, CEA, CA199, CHROMGRNA in the last 8760 hours.  Assessment and Plan:  3 weeks status post right renal cell carcinoma CT-guided cryoablation with general anesthesia. Postop course was complicated by acute flash pulmonary edema requiring cardio pulmonary resuscitation and reintubation. He was aggressively diuresed in the ICU followed by a repeat left heart catheterization demonstrate no significant change from the prior cath. He stabilized and was eventually discharged from the hospital.  He has recovered as an outpatient very well. No current abdominal pain, flank pain, fever, or hematuria.  No signs of recurrent CHF. He is back to his baseline.  Plan: Repeat surveillance CT imaging at 6 months with an outpatient visit to review the imaging.    Electronically Signed: Greggory Keen 11/18/2015, 11:24 AM   I spent a total of    25 Minutes in face to face in clinical consultation, greater than 50% of which was counseling/coordinating care for status post ablation of a right renal cell carcinoma.

## 2015-12-14 ENCOUNTER — Encounter: Payer: Self-pay | Admitting: Interventional Radiology

## 2016-01-27 ENCOUNTER — Encounter: Payer: Self-pay | Admitting: Interventional Radiology

## 2016-04-20 ENCOUNTER — Other Ambulatory Visit: Payer: Self-pay | Admitting: *Deleted

## 2016-04-20 ENCOUNTER — Other Ambulatory Visit (HOSPITAL_COMMUNITY): Payer: Self-pay | Admitting: Interventional Radiology

## 2016-04-20 DIAGNOSIS — C641 Malignant neoplasm of right kidney, except renal pelvis: Secondary | ICD-10-CM

## 2016-05-16 ENCOUNTER — Other Ambulatory Visit: Payer: Medicare HMO

## 2016-06-01 ENCOUNTER — Inpatient Hospital Stay: Admission: RE | Admit: 2016-06-01 | Payer: Medicare HMO | Source: Ambulatory Visit

## 2016-10-23 DIAGNOSIS — M542 Cervicalgia: Secondary | ICD-10-CM | POA: Insufficient documentation

## 2016-11-28 ENCOUNTER — Encounter: Payer: Self-pay | Admitting: Radiology

## 2017-05-14 DIAGNOSIS — I442 Atrioventricular block, complete: Secondary | ICD-10-CM | POA: Insufficient documentation

## 2017-07-09 DIAGNOSIS — R7989 Other specified abnormal findings of blood chemistry: Secondary | ICD-10-CM | POA: Insufficient documentation

## 2017-07-09 DIAGNOSIS — J449 Chronic obstructive pulmonary disease, unspecified: Secondary | ICD-10-CM

## 2017-07-09 DIAGNOSIS — E6609 Other obesity due to excess calories: Secondary | ICD-10-CM | POA: Insufficient documentation

## 2017-07-09 DIAGNOSIS — I129 Hypertensive chronic kidney disease with stage 1 through stage 4 chronic kidney disease, or unspecified chronic kidney disease: Secondary | ICD-10-CM | POA: Insufficient documentation

## 2017-07-09 HISTORY — DX: Chronic obstructive pulmonary disease, unspecified: J44.9

## 2017-10-02 DIAGNOSIS — N183 Chronic kidney disease, stage 3 unspecified: Secondary | ICD-10-CM | POA: Insufficient documentation

## 2018-08-21 DIAGNOSIS — Z87891 Personal history of nicotine dependence: Secondary | ICD-10-CM | POA: Insufficient documentation

## 2018-09-27 DIAGNOSIS — I2721 Secondary pulmonary arterial hypertension: Secondary | ICD-10-CM | POA: Insufficient documentation

## 2018-09-27 DIAGNOSIS — E039 Hypothyroidism, unspecified: Secondary | ICD-10-CM | POA: Insufficient documentation

## 2018-09-27 DIAGNOSIS — Z8673 Personal history of transient ischemic attack (TIA), and cerebral infarction without residual deficits: Secondary | ICD-10-CM | POA: Insufficient documentation

## 2018-09-27 DIAGNOSIS — I700 Atherosclerosis of aorta: Secondary | ICD-10-CM | POA: Insufficient documentation

## 2018-09-27 DIAGNOSIS — D509 Iron deficiency anemia, unspecified: Secondary | ICD-10-CM | POA: Insufficient documentation

## 2018-10-30 DIAGNOSIS — I5022 Chronic systolic (congestive) heart failure: Secondary | ICD-10-CM | POA: Insufficient documentation

## 2018-11-12 DIAGNOSIS — I25118 Atherosclerotic heart disease of native coronary artery with other forms of angina pectoris: Secondary | ICD-10-CM | POA: Insufficient documentation

## 2018-12-05 DIAGNOSIS — R822 Biliuria: Secondary | ICD-10-CM | POA: Insufficient documentation

## 2018-12-05 DIAGNOSIS — N179 Acute kidney failure, unspecified: Secondary | ICD-10-CM | POA: Insufficient documentation

## 2019-02-24 ENCOUNTER — Encounter: Payer: Self-pay | Admitting: *Deleted

## 2019-02-24 ENCOUNTER — Other Ambulatory Visit: Payer: Self-pay | Admitting: *Deleted

## 2019-02-24 DIAGNOSIS — Z789 Other specified health status: Secondary | ICD-10-CM | POA: Insufficient documentation

## 2019-02-24 DIAGNOSIS — I2510 Atherosclerotic heart disease of native coronary artery without angina pectoris: Secondary | ICD-10-CM | POA: Insufficient documentation

## 2019-02-24 DIAGNOSIS — I10 Essential (primary) hypertension: Secondary | ICD-10-CM | POA: Insufficient documentation

## 2019-02-24 DIAGNOSIS — D49519 Neoplasm of unspecified behavior of unspecified kidney: Secondary | ICD-10-CM | POA: Insufficient documentation

## 2019-02-24 DIAGNOSIS — I219 Acute myocardial infarction, unspecified: Secondary | ICD-10-CM | POA: Insufficient documentation

## 2019-02-24 DIAGNOSIS — N2889 Other specified disorders of kidney and ureter: Secondary | ICD-10-CM | POA: Insufficient documentation

## 2019-02-24 DIAGNOSIS — E785 Hyperlipidemia, unspecified: Secondary | ICD-10-CM | POA: Insufficient documentation

## 2019-02-24 DIAGNOSIS — I209 Angina pectoris, unspecified: Secondary | ICD-10-CM | POA: Insufficient documentation

## 2019-02-24 NOTE — Patient Outreach (Addendum)
  [facility]) Care Management Chronic Special Needs Program    02/24/2019  Name: [name], DOB: May 14, 1960  MRN: [id number]   [name] is enrolled in a chronic special needs plan for Heart Failure. Attempted to reach [name] via mobile/home number to complete initial assessment; no answer; left HIPAA compliant message requesting return call.  Plan: If client does not return call, will make second outreach call within one week.  Kelli Churn RN, CCM, [facility] Care Management [phone]

## 2019-03-03 ENCOUNTER — Other Ambulatory Visit: Payer: Self-pay | Admitting: *Deleted

## 2019-03-03 NOTE — Patient Outreach (Signed)
  [facility]) Care Management Chronic Special Needs Program    03/03/2019  Name: [name], DOB: [birth year]  MRN: [id number]   [name] is enrolled in a chronic special needs plan for Heart Failure. Attempted to reach Mr. Micucci via contact number to complete initial assessment; no answer; left HIPAA compliant message requesting return call.  Plan: If client does not return call, will make third outreach call within one week.  Kelli Churn RN, CCM, [place] [facility] Care Management [phone]

## 2019-03-05 ENCOUNTER — Encounter: Payer: Self-pay | Admitting: Pharmacist

## 2019-03-05 ENCOUNTER — Other Ambulatory Visit: Payer: Self-pay | Admitting: *Deleted

## 2019-03-05 ENCOUNTER — Encounter: Payer: Self-pay | Admitting: *Deleted

## 2019-03-05 DIAGNOSIS — I25810 Atherosclerosis of coronary artery bypass graft(s) without angina pectoris: Secondary | ICD-10-CM

## 2019-03-05 DIAGNOSIS — N183 Chronic kidney disease, stage 3 unspecified: Secondary | ICD-10-CM

## 2019-03-05 DIAGNOSIS — G44209 Tension-type headache, unspecified, not intractable: Secondary | ICD-10-CM

## 2019-03-05 DIAGNOSIS — I5043 Acute on chronic combined systolic (congestive) and diastolic (congestive) heart failure: Secondary | ICD-10-CM

## 2019-03-05 MED ORDER — ALLOPURINOL 300 MG PO TABS
300.00 | ORAL_TABLET | ORAL | Status: DC
Start: 2019-03-07 — End: 2019-03-05

## 2019-03-05 MED ORDER — FUROSEMIDE 10 MG/ML IJ SOLN
40.00 | INTRAMUSCULAR | Status: DC
Start: 2019-03-06 — End: 2019-03-05

## 2019-03-05 MED ORDER — UMECLIDINIUM-VILANTEROL 62.5-25 MCG/INH IN AEPB
1.00 | INHALATION_SPRAY | RESPIRATORY_TRACT | Status: DC
Start: 2019-03-15 — End: 2019-03-05

## 2019-03-05 MED ORDER — ISOSORBIDE MONONITRATE ER 60 MG PO TB24
60.00 | ORAL_TABLET | ORAL | Status: DC
Start: 2019-03-08 — End: 2019-03-05

## 2019-03-05 MED ORDER — FERROUS SULFATE 300 (60 FE) MG/5ML PO SYRP
300.00 | ORAL_SOLUTION | ORAL | Status: DC
Start: 2019-03-15 — End: 2019-03-05

## 2019-03-05 MED ORDER — FLUOXETINE HCL 20 MG PO CAPS
40.00 | ORAL_CAPSULE | ORAL | Status: DC
Start: 2019-03-15 — End: 2019-03-05

## 2019-03-05 MED ORDER — CARVEDILOL 12.5 MG PO TABS
25.00 | ORAL_TABLET | ORAL | Status: DC
Start: 2019-03-12 — End: 2019-03-05

## 2019-03-05 MED ORDER — NITROGLYCERIN 0.4 MG SL SUBL
0.40 | SUBLINGUAL_TABLET | SUBLINGUAL | Status: DC
Start: ? — End: 2019-03-05

## 2019-03-05 MED ORDER — ASPIRIN 81 MG PO TBEC
81.00 | DELAYED_RELEASE_TABLET | ORAL | Status: DC
Start: 2019-03-13 — End: 2019-03-05

## 2019-03-05 MED ORDER — CHOLECALCIFEROL 25 MCG (1000 UT) PO TABS
2000.00 | ORAL_TABLET | ORAL | Status: DC
Start: 2019-03-15 — End: 2019-03-05

## 2019-03-05 MED ORDER — LEVOTHYROXINE SODIUM 25 MCG PO TABS
25.00 | ORAL_TABLET | ORAL | Status: DC
Start: 2019-03-13 — End: 2019-03-05

## 2019-03-05 MED ORDER — PANTOPRAZOLE SODIUM 40 MG PO TBEC
40.00 | DELAYED_RELEASE_TABLET | ORAL | Status: DC
Start: 2019-03-13 — End: 2019-03-05

## 2019-03-05 MED ORDER — HEPARIN SODIUM (PORCINE) 5000 UNIT/ML IJ SOLN
5000.00 | INTRAMUSCULAR | Status: DC
Start: 2019-03-12 — End: 2019-03-05

## 2019-03-05 MED ORDER — ATORVASTATIN CALCIUM 40 MG PO TABS
40.00 | ORAL_TABLET | ORAL | Status: DC
Start: 2019-03-08 — End: 2019-03-05

## 2019-03-05 MED ORDER — SPIRONOLACTONE 25 MG PO TABS
12.50 | ORAL_TABLET | ORAL | Status: DC
Start: 2019-03-07 — End: 2019-03-05

## 2019-03-05 MED ORDER — TAMSULOSIN HCL 0.4 MG PO CAPS
0.40 | ORAL_CAPSULE | ORAL | Status: DC
Start: 2019-03-13 — End: 2019-03-05

## 2019-03-05 MED ORDER — CLOPIDOGREL BISULFATE 75 MG PO TABS
75.00 | ORAL_TABLET | ORAL | Status: DC
Start: 2019-03-15 — End: 2019-03-05

## 2019-03-05 MED ORDER — AMIODARONE HCL 200 MG PO TABS
200.00 | ORAL_TABLET | ORAL | Status: DC
Start: 2019-03-13 — End: 2019-03-05

## 2019-03-05 NOTE — Patient Outreach (Signed)
  [facility]) Care Management Chronic Special Needs Program    03/05/2019  Name: [name], DOB: 1960/11/01  MRN: [id number]   [name] is enrolled in a chronic special needs plan for Heart Failure.  [name] enrolled in the special needs plan with effective date of 01/24/19 and he completed a health risk assessment on 01/09/19. RNCM made two unsuccessful telephone outreaches on 2/1 and 2/8 with purpose of completing initial telephone assessment.  Client was admitted to [facility] with diagnosis of congestive heart failure due to cardiomyopathy post CRT ( Cardiac resynchronization therapy) with pacemaker and defibrillator.  after he was sent to the emergency department from his cardiologist office on 03/03/19. The individualized care plan was developed based on available date and faxed to [facility] Utilization Management department.   Plan: RNCM will monitor client's clinical status and follow up with client upon hospital discharge.  Kelli Churn RN, CCM, [place] Management Coordinator [facility] Care Management [phone]

## 2019-03-06 MED ORDER — ONDANSETRON HCL 4 MG/2ML IJ SOLN
4.00 | INTRAMUSCULAR | Status: DC
Start: ? — End: 2019-03-06

## 2019-03-06 MED ORDER — GENERIC EXTERNAL MEDICATION
1.00 | Status: DC
Start: 2019-03-09 — End: 2019-03-06

## 2019-03-07 MED ORDER — TRAMADOL HCL 50 MG PO TABS
50.00 | ORAL_TABLET | ORAL | Status: DC
Start: ? — End: 2019-03-07

## 2019-03-07 MED ORDER — ACETAMINOPHEN 325 MG PO TABS
650.00 | ORAL_TABLET | ORAL | Status: DC
Start: ? — End: 2019-03-07

## 2019-03-08 MED ORDER — ALBUTEROL SULFATE (5 MG/ML) 0.5% IN NEBU
2.50 | INHALATION_SOLUTION | RESPIRATORY_TRACT | Status: DC
Start: ? — End: 2019-03-08

## 2019-03-08 MED ORDER — GENERIC EXTERNAL MEDICATION
5.00 | Status: DC
Start: ? — End: 2019-03-08

## 2019-03-08 MED ORDER — NYSTATIN 100000 UNIT/GM EX POWD
CUTANEOUS | Status: DC
Start: 2019-03-14 — End: 2019-03-08

## 2019-03-08 MED ORDER — ALBUMIN HUMAN 25 % IV SOLN
12.50 | INTRAVENOUS | Status: DC
Start: 2019-03-08 — End: 2019-03-08

## 2019-03-10 MED ORDER — GENERIC EXTERNAL MEDICATION
0.20 | Status: DC
Start: ? — End: 2019-03-10

## 2019-03-10 MED ORDER — GENERIC EXTERNAL MEDICATION
500.00 | Status: DC
Start: 2019-03-12 — End: 2019-03-10

## 2019-03-10 MED ORDER — LORAZEPAM 1 MG PO TABS
1.00 | ORAL_TABLET | ORAL | Status: DC
Start: ? — End: 2019-03-10

## 2019-03-10 MED ORDER — GENERIC EXTERNAL MEDICATION
0.00 | Status: DC
Start: ? — End: 2019-03-10

## 2019-03-10 MED ORDER — GENERIC EXTERNAL MEDICATION
2.25 | Status: DC
Start: 2019-03-11 — End: 2019-03-10

## 2019-03-10 MED ORDER — MIDODRINE HCL 5 MG PO TABS
5.00 | ORAL_TABLET | ORAL | Status: DC
Start: 2019-03-11 — End: 2019-03-10

## 2019-03-10 MED ORDER — ATORVASTATIN CALCIUM 10 MG PO TABS
20.00 | ORAL_TABLET | ORAL | Status: DC
Start: 2019-03-14 — End: 2019-03-10

## 2019-03-11 MED ORDER — GENERIC EXTERNAL MEDICATION
15.00 L | Status: DC
Start: ? — End: 2019-03-11

## 2019-03-11 MED ORDER — HEPARIN SODIUM (PORCINE) 10000 UNIT/ML IJ SOLN
0.50 | INTRAMUSCULAR | Status: DC
Start: ? — End: 2019-03-11

## 2019-03-12 MED ORDER — SODIUM CHLORIDE 0.9 % IV SOLN
3.00 | INTRAVENOUS | Status: DC
Start: ? — End: 2019-03-12

## 2019-03-12 MED ORDER — GENERIC EXTERNAL MEDICATION
1.00 | Status: DC
Start: 2019-03-12 — End: 2019-03-12

## 2019-03-12 MED ORDER — GENERIC EXTERNAL MEDICATION
0.04 | Status: DC
Start: ? — End: 2019-03-12

## 2019-03-12 MED ORDER — MIDODRINE HCL 5 MG PO TABS
10.00 | ORAL_TABLET | ORAL | Status: DC
Start: 2019-03-14 — End: 2019-03-12

## 2019-03-12 MED ORDER — GENERIC EXTERNAL MEDICATION
1.00 | Status: DC
Start: 2019-03-13 — End: 2019-03-12

## 2019-03-14 MED ORDER — GENERIC EXTERNAL MEDICATION
Status: DC
Start: ? — End: 2019-03-14

## 2019-03-14 MED ORDER — HEPARIN SOD (PORCINE) IN D5W 100 UNIT/ML IV SOLN
30.00 | INTRAVENOUS | Status: DC
Start: ? — End: 2019-03-14

## 2019-03-14 MED ORDER — SILDENAFIL CITRATE 20 MG PO TABS
10.00 | ORAL_TABLET | ORAL | Status: DC
Start: 2019-03-14 — End: 2019-03-14

## 2019-03-14 MED ORDER — PANTOPRAZOLE SODIUM 40 MG IV SOLR
40.00 | INTRAVENOUS | Status: DC
Start: 2019-03-15 — End: 2019-03-14

## 2019-03-14 MED ORDER — GLUCAGON (RDNA) 1 MG IJ KIT
1.00 | PACK | INTRAMUSCULAR | Status: DC
Start: ? — End: 2019-03-14

## 2019-03-14 MED ORDER — LACTULOSE 10 GM/15ML PO SOLN
20.00 | ORAL | Status: DC
Start: 2019-03-14 — End: 2019-03-14

## 2019-03-14 MED ORDER — PROPOFOL 100 MG/10ML IV EMUL
0.00 | INTRAVENOUS | Status: DC
Start: ? — End: 2019-03-14

## 2019-03-14 MED ORDER — GLUCOSE 40 % PO GEL
15.00 | ORAL | Status: DC
Start: ? — End: 2019-03-14

## 2019-03-14 MED ORDER — HYDROCORTISONE NA SUCCINATE PF 100 MG IJ SOLR
100.00 | INTRAMUSCULAR | Status: DC
Start: 2019-03-14 — End: 2019-03-14

## 2019-03-14 MED ORDER — CHLORHEXIDINE GLUCONATE 0.12 % MT SOLN
15.00 | OROMUCOSAL | Status: DC
Start: 2019-03-14 — End: 2019-03-14

## 2019-03-14 MED ORDER — ASPIRIN 81 MG PO CHEW
81.00 | CHEWABLE_TABLET | ORAL | Status: DC
Start: 2019-03-15 — End: 2019-03-14

## 2019-03-14 MED ORDER — DEXTROSE 10 % IV SOLN
125.00 | INTRAVENOUS | Status: DC
Start: ? — End: 2019-03-14

## 2019-03-14 MED ORDER — GENERIC EXTERNAL MEDICATION
1.00 | Status: DC
Start: 2019-03-15 — End: 2019-03-14

## 2019-03-14 MED ORDER — HEPARIN SOD (PORCINE) IN D5W 100 UNIT/ML IV SOLN
4000.00 | INTRAVENOUS | Status: DC
Start: ? — End: 2019-03-14

## 2019-03-14 MED ORDER — GENERIC EXTERNAL MEDICATION
15.00 L | Status: DC
Start: ? — End: 2019-03-14

## 2019-03-14 MED ORDER — HEPARIN SOD (PORCINE) IN D5W 100 UNIT/ML IV SOLN
10.40 | INTRAVENOUS | Status: DC
Start: ? — End: 2019-03-14

## 2019-03-14 MED ORDER — FENTANYL CITRATE (PF) 50 MCG/ML IJ SOLN
0.00 | INTRAMUSCULAR | Status: DC
Start: ? — End: 2019-03-14

## 2019-03-14 MED ORDER — GENERIC EXTERNAL MEDICATION
1.00 | Status: DC
Start: 2019-03-14 — End: 2019-03-14

## 2019-03-14 MED ORDER — CHLORHEXIDINE GLUCONATE 0.12 % MT SOLN
15.00 | OROMUCOSAL | Status: DC
Start: ? — End: 2019-03-14

## 2019-03-14 MED ORDER — INSULIN LISPRO 100 UNIT/ML ~~LOC~~ SOLN
0.00 | SUBCUTANEOUS | Status: DC
Start: 2019-03-14 — End: 2019-03-14

## 2019-03-17 ENCOUNTER — Other Ambulatory Visit: Payer: Self-pay | Admitting: *Deleted

## 2019-03-17 MED ORDER — MIDAZOLAM HCL 5 MG/5ML IJ SOLN
2.00 | INTRAMUSCULAR | Status: DC
Start: ? — End: 2019-03-17

## 2019-03-17 MED ORDER — GLYCOPYRROLATE 0.4 MG/2ML IJ SOLN
0.20 | INTRAMUSCULAR | Status: DC
Start: ? — End: 2019-03-17

## 2019-03-17 NOTE — Patient Outreach (Signed)
  [facility]) Care Management Chronic Special Needs Program    03/17/2019  Name: SAVA GEHMAN, DOB: Aug 11, 1960  MRN: [id number]   Case Closure   Mr. Cayson Weisenberger is enrolled in a chronic special needs plan for Heart Failure. Client was admitted to [facility] with diagnosis of congestive heart failure due to cardiomyopathy post CRT ( Cardiac resynchronization therapy) with pacemaker and defibrillator.  after he was sent to the emergency department from his cardiologist office on 03/03/19. Review of chart in [place] today indicates client expired at 11:15 am on 03/06/2019 at [facility].  Plan: Will close case.  Barrington Ellison [place] Management Coordinator Office Phone [phone] Office Fax [phone]

## 2019-03-18 ENCOUNTER — Ambulatory Visit: Payer: Self-pay | Admitting: Pharmacist

## 2019-03-18 ENCOUNTER — Other Ambulatory Visit: Payer: Self-pay | Admitting: Pharmacist

## 2019-03-18 NOTE — Patient Outreach (Signed)
[facility]) Care Management  03/18/2019  ROEL SIVERS [birth year] [id number]  Patient's case is being closed as his wife called and let the [place] Nurse know he passed on 02/04/2020 at [place], PharmD, [place] Pharmacist [phone]

## 2019-03-24 DEATH — deceased
